# Patient Record
Sex: Male | Born: 1990 | Race: White | Hispanic: No | Marital: Single | Smoking: Current every day smoker
Health system: Southern US, Community
[De-identification: ages and names within clinical notes are randomized; demographics above are authoritative.]

## PROBLEM LIST (undated history)

## (undated) DIAGNOSIS — J45909 Unspecified asthma, uncomplicated: Secondary | ICD-10-CM

---
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
## 2012-11-19 ENCOUNTER — Encounter (HOSPITAL_COMMUNITY): Payer: Self-pay | Admitting: Emergency Medicine

## 2012-11-19 ENCOUNTER — Emergency Department (HOSPITAL_COMMUNITY)
Admission: EM | Admit: 2012-11-19 | Discharge: 2012-11-19 | Disposition: A | Discharge status: Home / Self Care | Payer: Self-pay | Attending: Emergency Medicine | Admitting: Emergency Medicine

## 2012-11-19 ENCOUNTER — Emergency Department (HOSPITAL_COMMUNITY): Payer: Self-pay

## 2012-11-19 DIAGNOSIS — IMO0002 Reserved for concepts with insufficient information to code with codable children: Secondary | ICD-10-CM | POA: Insufficient documentation

## 2012-11-19 DIAGNOSIS — M549 Dorsalgia, unspecified: Secondary | ICD-10-CM

## 2012-11-19 DIAGNOSIS — Y92410 Unspecified street and highway as the place of occurrence of the external cause: Secondary | ICD-10-CM | POA: Insufficient documentation

## 2012-11-19 DIAGNOSIS — Y9389 Activity, other specified: Secondary | ICD-10-CM | POA: Insufficient documentation

## 2012-11-19 DIAGNOSIS — F17200 Nicotine dependence, unspecified, uncomplicated: Secondary | ICD-10-CM | POA: Insufficient documentation

## 2012-11-19 LAB — BASIC METABOLIC PANEL
BUN: 13 mg/dL (ref 6–23)
CO2: 28 mEq/L (ref 19–32)
Calcium: 10.1 mg/dL (ref 8.4–10.5)
Chloride: 101 mEq/L (ref 96–112)
Creatinine, Ser: 0.74 mg/dL (ref 0.50–1.35)
GFR calc Af Amer: >90 mL/min (ref >90)
GFR calc non Af Amer: >90 mL/min (ref >90)
Glucose, Bld: 90 mg/dL (ref 70–99)
Potassium: 3.8 mEq/L (ref 3.5–5.1)
Sodium: 138 mEq/L (ref 135–145)

## 2012-11-19 LAB — CBC WITH DIFFERENTIAL/PLATELET
Basophils Relative: 1 % (ref 0–1)
Eosinophils Absolute: 0.1 10*3/uL (ref 0.0–0.7)
Eosinophils Relative: 2 % (ref 0–5)
HCT: 39.8 % (ref 39.0–52.0)
Hemoglobin: 14.4 g/dL (ref 13.0–17.0)
MCH: 31.9 pg (ref 26.0–34.0)
MCHC: 36.2 g/dL — ABNORMAL HIGH (ref 30.0–36.0)
MCV: 88.1 fL (ref 78.0–100.0)
Monocytes Absolute: 0.6 10*3/uL (ref 0.1–1.0)
Monocytes Relative: 10 % (ref 3–12)
Neutro Abs: 3.1 10*3/uL (ref 1.7–7.7)

## 2012-11-19 LAB — URINALYSIS, ROUTINE W REFLEX MICROSCOPIC
Bilirubin Urine: NEGATIVE
Hgb urine dipstick: NEGATIVE
Ketones, ur: NEGATIVE mg/dL
Specific Gravity, Urine: 1.025 (ref 1.005–1.030)
Urobilinogen, UA: 0.2 mg/dL (ref 0.0–1.0)

## 2012-11-19 MED ORDER — ONDANSETRON HCL 4 MG/2ML IJ SOLN
4.0000 mg | Freq: Once | INTRAMUSCULAR | Status: AC
  Administered 2012-11-19: 4 mg via INTRAVENOUS
  Filled 2012-11-19: qty 2

## 2012-11-19 MED ORDER — HYDROMORPHONE HCL PF 1 MG/ML IJ SOLN
1.0000 mg | Freq: Once | INTRAMUSCULAR | Status: AC
  Administered 2012-11-19: 1 mg via INTRAVENOUS
  Filled 2012-11-19: qty 1

## 2012-11-19 MED ORDER — CYCLOBENZAPRINE HCL 10 MG PO TABS: 10.0000 mg | ORAL_TABLET | Freq: Two times a day (BID) | ORAL | Status: DC | PRN

## 2012-11-19 MED ORDER — IOHEXOL 300 MG/ML  SOLN
100.0000 mL | Freq: Once | INTRAMUSCULAR | Status: AC | PRN
  Administered 2012-11-19: 100 mL via INTRAVENOUS

## 2012-11-19 MED ORDER — SODIUM CHLORIDE 0.9 % IV BOLUS (SEPSIS)
500.0000 mL | Freq: Once | INTRAVENOUS | Status: AC
  Administered 2012-11-19: 500 mL via INTRAVENOUS
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
## 2012-11-30 ENCOUNTER — Emergency Department (HOSPITAL_COMMUNITY): Payer: Self-pay

## 2012-11-30 ENCOUNTER — Encounter (HOSPITAL_COMMUNITY): Payer: Self-pay | Admitting: Emergency Medicine

## 2012-11-30 ENCOUNTER — Emergency Department (HOSPITAL_COMMUNITY)
Admission: EM | Admit: 2012-11-30 | Discharge: 2012-11-30 | Disposition: A | Discharge status: Home / Self Care | Payer: Self-pay | Attending: Emergency Medicine | Admitting: Emergency Medicine

## 2012-11-30 DIAGNOSIS — F17200 Nicotine dependence, unspecified, uncomplicated: Secondary | ICD-10-CM | POA: Insufficient documentation

## 2012-11-30 DIAGNOSIS — M949 Disorder of cartilage, unspecified: Secondary | ICD-10-CM | POA: Insufficient documentation

## 2012-11-30 DIAGNOSIS — S6992XA Unspecified injury of left wrist, hand and finger(s), initial encounter: Secondary | ICD-10-CM

## 2012-11-30 DIAGNOSIS — S8990XA Unspecified injury of unspecified lower leg, initial encounter: Secondary | ICD-10-CM | POA: Insufficient documentation

## 2012-11-30 DIAGNOSIS — IMO0002 Reserved for concepts with insufficient information to code with codable children: Secondary | ICD-10-CM | POA: Insufficient documentation

## 2012-11-30 DIAGNOSIS — S99919A Unspecified injury of unspecified ankle, initial encounter: Secondary | ICD-10-CM | POA: Insufficient documentation

## 2012-11-30 DIAGNOSIS — Y9355 Activity, bike riding: Secondary | ICD-10-CM | POA: Insufficient documentation

## 2012-11-30 DIAGNOSIS — S6980XA Other specified injuries of unspecified wrist, hand and finger(s), initial encounter: Secondary | ICD-10-CM | POA: Insufficient documentation

## 2012-11-30 DIAGNOSIS — M25562 Pain in left knee: Secondary | ICD-10-CM

## 2012-11-30 DIAGNOSIS — T07XXXA Unspecified multiple injuries, initial encounter: Secondary | ICD-10-CM

## 2012-11-30 DIAGNOSIS — Z79899 Other long term (current) drug therapy: Secondary | ICD-10-CM | POA: Insufficient documentation

## 2012-11-30 DIAGNOSIS — S6990XA Unspecified injury of unspecified wrist, hand and finger(s), initial encounter: Secondary | ICD-10-CM | POA: Insufficient documentation

## 2012-11-30 DIAGNOSIS — J45909 Unspecified asthma, uncomplicated: Secondary | ICD-10-CM | POA: Insufficient documentation

## 2012-11-30 DIAGNOSIS — M899 Disorder of bone, unspecified: Secondary | ICD-10-CM | POA: Insufficient documentation

## 2012-11-30 DIAGNOSIS — Y92410 Unspecified street and highway as the place of occurrence of the external cause: Secondary | ICD-10-CM | POA: Insufficient documentation

## 2012-11-30 HISTORY — DX: Unspecified asthma, uncomplicated: J45.909

## 2012-11-30 MED ORDER — BACITRACIN ZINC 500 UNIT/GM EX OINT
TOPICAL_OINTMENT | Freq: Once | CUTANEOUS | Status: AC
  Administered 2012-11-30: 1 via TOPICAL
  Filled 2012-11-30: qty 0.9

## 2012-11-30 MED ORDER — HYDROMORPHONE HCL PF 1 MG/ML IJ SOLN
1.0000 mg | Freq: Once | INTRAMUSCULAR | Status: AC
  Administered 2012-11-30: 1 mg via INTRAMUSCULAR

## 2012-11-30 MED ORDER — CEPHALEXIN 500 MG PO CAPS: 500.0000 mg | ORAL_CAPSULE | Freq: Four times a day (QID) | ORAL | Status: DC

## 2012-11-30 MED ORDER — HYDROCODONE-ACETAMINOPHEN 5-325 MG PO TABS: 1.0000 | ORAL_TABLET | ORAL | Status: DC | PRN

## 2012-11-30 MED ORDER — HYDROCODONE-ACETAMINOPHEN 5-325 MG PO TABS
2.0000 | ORAL_TABLET | Freq: Once | ORAL | Status: AC
  Administered 2012-11-30: 2 via ORAL
  Filled 2012-11-30: qty 2

## 2012-11-30 MED ORDER — HYDROCODONE-ACETAMINOPHEN 5-325 MG PO TABS: 1.0000 | ORAL_TABLET | Freq: Once | ORAL | Status: DC

## 2012-11-30 MED ORDER — HYDROMORPHONE HCL PF 1 MG/ML IJ SOLN
INTRAMUSCULAR | Status: AC
  Filled 2012-11-30: qty 1
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
## 2012-11-30 NOTE — ED Notes (Signed)
No obvious deformities.
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
## 2012-12-01 NOTE — ED Provider Notes (Signed)
Medical screening examination/treatment/procedure(s) were performed by non-physician practitioner and as supervising physician I was immediately available for consultation/collaboration.  Donnetta Hutching, MD 12/01/12 1110

## 2013-09-02 ENCOUNTER — Emergency Department (HOSPITAL_COMMUNITY)
Admission: EM | Admit: 2013-09-02 | Discharge: 2013-09-02 | Disposition: A | Discharge status: Home / Self Care | Payer: Self-pay | Attending: Emergency Medicine | Admitting: Emergency Medicine

## 2013-09-02 ENCOUNTER — Encounter (HOSPITAL_COMMUNITY): Payer: Self-pay | Admitting: Emergency Medicine

## 2013-09-02 DIAGNOSIS — K089 Disorder of teeth and supporting structures, unspecified: Secondary | ICD-10-CM | POA: Insufficient documentation

## 2013-09-02 DIAGNOSIS — K029 Dental caries, unspecified: Secondary | ICD-10-CM | POA: Insufficient documentation

## 2013-09-02 DIAGNOSIS — J45909 Unspecified asthma, uncomplicated: Secondary | ICD-10-CM | POA: Insufficient documentation

## 2013-09-02 DIAGNOSIS — Z880 Allergy status to penicillin: Secondary | ICD-10-CM | POA: Insufficient documentation

## 2013-09-02 DIAGNOSIS — F17200 Nicotine dependence, unspecified, uncomplicated: Secondary | ICD-10-CM | POA: Insufficient documentation

## 2013-09-02 MED ORDER — OXYCODONE-ACETAMINOPHEN 5-325 MG PO TABS
1.0000 | ORAL_TABLET | Freq: Once | ORAL | Status: AC
  Administered 2013-09-02: 1 via ORAL
  Filled 2013-09-02: qty 1

## 2013-09-02 MED ORDER — HYDROCODONE-ACETAMINOPHEN 5-325 MG PO TABS: 1.0000 | ORAL_TABLET | ORAL | Status: DC | PRN

## 2013-09-02 MED ORDER — AMOXICILLIN 500 MG PO CAPS: 500.0000 mg | ORAL_CAPSULE | Freq: Three times a day (TID) | ORAL | Status: DC

## 2013-09-02 NOTE — ED Provider Notes (Signed)
Medical screening examination/treatment/procedure(s) were performed by non-physician practitioner and as supervising physician I was immediately available for consultation/collaboration.     Rane Blitch, MD 09/02/13 1522 

## 2013-09-02 NOTE — ED Notes (Signed)
Pt c/o front L tooth pain. Pt states the tooth has been broken off for about 6 weeks.

## 2013-09-02 NOTE — Discharge Instructions (Signed)
°Emergency Department Resource Guide °1) Find a Doctor and Pay Out of Pocket °Although you won't have to find out who is covered by your insurance plan, it is a good idea to ask around and get recommendations. You will then need to call the office and see if the doctor you have chosen will accept you as a new patient and what types of options they offer for patients who are self-pay. Some doctors offer discounts or will set up payment plans for their patients who do not have insurance, but you will need to ask so you aren't surprised when you get to your appointment. ° °2) Contact Your Local Health Department °Not all health departments have doctors that can see patients for sick visits, but many do, so it is worth a call to see if yours does. If you don't know where your local health department is, you can check in your phone book. The CDC also has a tool to help you locate your state's health department, and many state websites also have listings of all of their local health departments. ° °3) Find a Walk-in Clinic °If your illness is not likely to be very severe or complicated, you may want to try a walk in clinic. These are popping up all over the country in pharmacies, drugstores, and shopping centers. They're usually staffed by nurse practitioners or physician assistants that have been trained to treat common illnesses and complaints. They're usually fairly quick and inexpensive. However, if you have serious medical issues or chronic medical problems, these are probably not your best option. ° °No Primary Care Doctor: °- Call [facility] at  832-8000 - they can help you locate a primary care doctor that  accepts your insurance, provides certain services, etc. °- Physician Referral Service- 1-800-533-3463 ° °Chronic Pain Problems: °Organization         Address  Phone   Notes  °[facility]  (336) 297-2271 Patients need to be referred by their primary care doctor.  ° °Medication  Assistance: °Organization         Address  Phone   Notes  °[place] [facility] [address] °[place], [place] [address] (336) 641-8030 --Must be a resident of [place] °-- Must have NO insurance coverage whatsoever (no Medicaid/ Medicare, etc.) °-- The pt. MUST have a primary care doctor that directs their care regularly and follows them in the community °  °[facility]  (866) 331-1348   °[employer]  (888) 892-1162   ° °Agencies that provide inexpensive medical care: °Organization         Address  Phone   Notes  °[facility]  (336) 832-8035   °[facility]    (336) 832-7272   °[facility] [address] °[place], [place] [address] (336) 832-4777   °[facility] [address], °[place] (336) 271-4999   °[facility]    (336) 373-0678   °[facility]    (336) 272-1050   °[facility] ° [address], [place] Phone:  (336) 832-4444, Fax:  (336) 832-4440 Hours of Operation:  9 am - 6 pm, M-F.  Also accepts Medicaid/Medicare and self-pay.  °[facility] ° [address], [place] Phone: (336) 832-3150, Fax: (336) 832-3151. Hours of Operation:  8:30 am - 5:30 pm, M-F.  Also accepts Medicaid and self-pay.  °[facility] [place] [address], [place] Phone: (336) 878-6027   °[facility] [address], [place], [place] (336)723-1848, Ext. 123 Mondays & Thursdays: 7-9 AM.  First 15 patients are seen on a first come, first serve basis. °  ° °Medicaid-accepting [place] Providers: ° °Organization         Address  Phone   Notes  °[facility] [address], [place] (336) 641-2100 Also accepts self-pay patients.  °[facility] [address], [place] ° (336) 856-9996   °[facility] [address], [place]  (336) 288-8857   °[facility] [address] [place] [address], [place] (336) 299-7000   °Veita Bland [address], [place]  ° (336) 373-1557 Only accepts [place] Access Medicaid patients after they have their name applied to their card.  ° °Self-Pay (no insurance) in [place]: ° °Organization         Address  Phone   Notes  °Sickle Cell Patients, [facility] [address], [place] (336) 832-1970   °[facility] [address], [place] (336) 832-4400   °[facility] ° [address], [place] (336) 992-4800   °[facility]/Dr. Osei-Bonsu ° [address] [place] [address], [place] or [address], [place] (336) 841-8500 Phone number for both [place] and [place] locations is the same.  °[facility] [address], [place] (336) 299-0000   °[facility] [address] [place] [address], [place] or [address] (336) 852-7530 °(336) 878-2260   °[facility] [address], [place] (336) 350-1642, phone; (336) 294-5005, fax Sees patients 1st and 3rd Saturday of every month.  Must not qualify for public or private insurance (i.e. Medicaid, Medicare, [place] Health Choice, Veterans' Benefits) • Household income should be no more than 200% of the poverty level •The clinic cannot treat you if you are pregnant or think you are pregnant • Sexually transmitted diseases are not treated at the clinic.  ° ° °Dental Care: °Organization         Address  Phone  Notes  °[place] [facility] [address], [place] (336) 641-6152 Accepts children up to age 21 who are enrolled in Medicaid or [place] Health Choice; pregnant women with a Medicaid card; and children who have applied for Medicaid or [place] Health Choice, but were declined, whose parents can pay a reduced fee at time of service.  °[place]  [facility] [place]  [address], [place] (336) 641-7733 Accepts children up to age 21 who are enrolled in Medicaid or [place] Health Choice; pregnant women with a Medicaid card; and children who have applied for Medicaid or [place] Health Choice, but were declined, whose parents can pay a reduced fee at time of service.  °[facility] ° [address], [place] (336) 641-4533 Patients are seen by appointment only. Walk-ins are not accepted. [facility] will see patients 18 years of age and older. °Monday - Tuesday (8am-5pm) °Most Wednesdays (8:30-5pm) °$30 per visit, cash only  °[facility] ° [address], [place] (336) 641-4533 Patients are seen by appointment only. Walk-ins are not accepted. [facility] will see patients 18 years of age and older. °One   Wednesday Evening (Monthly: Volunteer Based).  $30 per visit, cash only  °[facility]  (919) 537-3737 for adults; Children under age 4, call Graduate Pediatric Dentistry at (919) [phone]. Children aged 4-14, please call (919) [phone] to request a pediatric application. ° Dental services are provided in all areas of dental care including fillings, crowns and bridges, complete and partial dentures, implants, gum treatment, root canals, and extractions. Preventive care is also provided. Treatment is provided to both adults and children. °Patients are selected via a lottery and there is often a waiting list. °  °[facility] [address], °[place] ° (336) 763-8833 [email or web address] °  °[facility] [address], [place], [place] (336)723-1848, Ext. 123 Second and Fourth Thursday of each month, opens at 6:30 AM; Clinic ends at 9 AM.  Patients are seen on a first-come first-served basis, and a limited number are seen during each clinic.  ° °[facility] ° [address], [place], [place] (336) 723-7904    Eligibility Requirements °You must have lived in [place], or [place] counties for at least the last three months. °  You cannot be eligible for state or federal sponsored healthcare insurance, including Veterans Administration, Medicaid, or Medicare. °  You generally cannot be eligible for healthcare insurance through your employer.  °  How to apply: °Eligibility screenings are held every Tuesday and Wednesday afternoon from 1:00 pm until 4:00 pm. You do not need an appointment for the interview!  °[facility] [address], [place], [place] 336-631-2330   °[facility]  336-342-8273   °[facility]  336-703-3100   °[facility]  336-570-6415   ° °Behavioral Health Resources in the Community: °Intensive Outpatient Programs °Organization         Address  Phone  Notes  °[facility] [address], [place], [place] 336-878-6098   °[facility] [address], [place], [place] 336-832-9800   °[facility] [address], [place], [place] ° 336-882-2125   °[facility] [address],  °[place], [place] 1-800-853-5163 or 336-641-4981   °Substance Abuse Resources °Organization         Address  Phone  Notes  °[facility]  336-882-2125   °[facility]  336-784-9470   °[facility]  336-285-9073   °[facility]  336-845-3988   °Residential & Outpatient Substance Abuse Program  1-800-659-3381   °Psychological Services °Organization         Address  Phone  Notes  °[facility]  336- 832-9600   °[facility]  336- 378-7881   °[facility] [address], [place] 1-800-853-5163 or 336-641-4981   ° °Mobile Crisis Teams °Organization         Address  Phone  Notes  °[facility], Mobile Crisis Care Unit  1-877-626-1772   °Assertive °[facility] ° [address]  [place], [place] 336-834-9664   °Sharon DeEsch [address] °[place] 336-554-5454   ° °Self-Help/Support Groups °Organization         Address  Phone             Notes  °[facility]  - variety of support groups  336- 373-1402 Call for more information  °Narcotics Anonymous (NA), [facility] [address], °[place]  2 meetings at this location  ° °  Residential Treatment Programs Organization         Address  Phone  Notes  [facility] [address],    [place]  1-478-295-6213   [facility]  [address] [place] [address], [place], [place] 469-629-5284   [facility] [address] [place], [place] 132-440-1027 Admissions: 8am-3pm M-F  [facility] [address]    [place], [place] 253-664-4034   [facility] [address] [place], [place], [place] 742-595-6387   [facility] [address]  [place], [place] 564-332-9518   [facility] Intensive Outpatient [address], [place], [place] 841-660-6301   [facility] ([facility] [address]  [place], [place] 6-010-932-3557 or 209 666 1742   [facility] [address], [place], [place] 623-762-8315 Accepts Medicaid  [facility] [address],  [place] 1-761-607-3710 Substance Abuse/Addiction Treatment   [facility] Organization         Address  Phone  Notes  [facility]  (984) 779-1044   Angie Fava, PhD [address] [place] [place] [place], [place]   779-781-1817 or 340-296-5359   [facility]   [address] [place], [place] (763)392-6391   [facility] [address] [place], [place] 6705027895 Insurance/Medicaid/sponsorship through [facility] [address]                                    [place], [place] [phone] Therapy/tele-psych/case    [facility] [address], [place] (408)455-1835    Dr. Lolly Mustache  250-095-0985   [facility]  [employer] [facility] Dept. 1) [address] [place] 2) [address] [place] [address] [place] 3)  [address], [place] (409)[phone] (215) [phone]  (831)600-9872   [facility] Child Abuse Hotline 336-254-9745 or 404-107-0473 (After Hours)      Dental Pain Toothache is pain in or around a tooth. It may get worse with chewing or with cold or heat.  HOME CARE  Your dentist may use a numbing medicine during treatment. If so, you may need to avoid eating until the medicine wears off. Ask your dentist about this.  Only take medicine as told by your dentist or doctor.  Avoid chewing food near the painful tooth until after all treatment is done. Ask your dentist about this. GET HELP RIGHT AWAY IF:   The problem gets worse or new problems appear.  You have a fever.  There is redness and puffiness (swelling) of the face, jaw, or neck.  You cannot open your mouth.  There is pain in the jaw.  There is very bad pain that is not helped by medicine. MAKE SURE YOU:   Understand these instructions.  Will watch your condition.  Will get help right away if you are not doing well or get worse. Document Released: 10/31/2007 Document Revised: 08/06/2011 Document Reviewed: 10/31/2007 [facility] Patient Information 2014 [place], [place].  Dental Caries Dental caries is tooth decay. This decay can cause a hole in teeth (cavity) that can get bigger and deeper over time. HOME CARE  Brush and floss your teeth. Do this at least two times a day.  Use a fluoride toothpaste.  Use a  mouth rinse if told by your dentist or doctor.  Eat less sugary and starchy foods. Drink less sugary drinks.  Avoid snacking often on sugary and starchy foods. Avoid sipping often on sugary drinks.  Keep regular checkups and cleanings with your dentist.  Use  fluoride supplements if told by your dentist or doctor.  Allow fluoride to be applied to teeth if told by your dentist or doctor. MAKE SURE YOU:  Understand these instructions.  Will watch your condition.  Will get help right away if you are not doing well or get worse. Document Released: 02/21/2008 Document Revised: 01/14/2013 Document Reviewed: 05/16/2012 [facility] Patient Information 2014 [place].

## 2013-09-02 NOTE — ED Provider Notes (Signed)
CSN: [id number]     Arrival date & time 09/02/13  1240 History   First MD Initiated Contact with Patient 09/02/13 1244     Chief Complaint  Patient presents with  . Dental Pain     (Consider location/radiation/quality/duration/timing/severity/associated sxs/prior Treatment) Patient is a 23 y.o. male presenting with tooth pain. The history is provided by the patient.  Dental Pain Location:  Upper Upper teeth location:  7/RU lateral incisor Quality:  Constant and throbbing Severity:  Severe Onset quality:  Gradual Duration:  1 day Timing:  Constant Progression:  Worsening Chronicity:  New Context: abscess, dental caries and poor dentition   Relieved by:  Nothing Worsened by:  Cold food/drink, touching and pressure Ineffective treatments:  Acetaminophen Associated symptoms: no fever and no oral lesions    Jonathan Hobbs is a 23 y.o. male who presents to the ED with dental pain. He states that he broke a tooth on the left upper about 6 weeks ago. Last night while eating he started having pain and it has continued. He was unable to sleep last night. He also has a wisdom tooth this is coming in on the top left and it is very painful.   Past Medical History  Diagnosis Date  . Asthma    History reviewed. No pertinent past surgical history. Family History  Problem Relation Age of Onset  . Cancer Other   . Diabetes Other    History  Substance Use Topics  . Smoking status: Current Every Day Smoker -- 0.50 packs/day for 7 years    Types: Cigarettes  . Smokeless tobacco: Never Used  . Alcohol Use: No    Review of Systems  Constitutional: Negative for fever and chills.  HENT: Positive for dental problem. Negative for ear pain, mouth sores and sore throat.   Respiratory: Negative for cough and wheezing.   Cardiovascular: Negative for chest pain.  Gastrointestinal: Negative for nausea, vomiting and abdominal pain.  Musculoskeletal: Negative for back pain.  Skin: Negative for rash.    Psychiatric/Behavioral: Negative for confusion.      Allergies  Ibuprofen; Oxycontin; and Penicillins  Home Medications   Current Outpatient Rx  Name  Route  Sig  Dispense  Refill  . Aspirin-Caffeine (BC FAST PAIN RELIEF PO)   Oral   Take 1 Package by mouth daily as needed (pain).          BP 141/75  Pulse 82  Temp(Src) 98.7 F (37.1 C) (Oral)  Resp 18  Ht 6' (1.829 m)  Wt 140 lb (63.504 kg)  BMI 18.98 kg/m2  SpO2 100% Physical Exam  Nursing note and vitals reviewed. Constitutional: He is oriented to person, place, and time. He appears well-developed and well-nourished.  HENT:  Head: Normocephalic.  Mouth/Throat: Uvula is midline, oropharynx is clear and moist and mucous membranes are normal. Dental caries present.    Broken tooth upper left. Left upper 3rd molar with swelling and erythema of gum surrounding the tooth. Multiple dental caries. Poor detention.  Eyes: Conjunctivae and EOM are normal.  Neck: Neck supple.  Cardiovascular: Normal rate and regular rhythm.   Pulmonary/Chest: Effort normal. He has no wheezes.  Abdominal: Soft. There is no tenderness.  Musculoskeletal: Normal range of motion.  Neurological: He is alert and oriented to person, place, and time. No cranial nerve deficit.  Skin: Skin is warm and dry.  Psychiatric: He has a normal mood and affect. His behavior is normal.    ED Course  Procedures  MDM  23 y.o. male with multiple dental caries, broken tooth that is painful and upper left 3rd molar that appears infected. Will treat with antibiotics and pain medication and he will follow up with a dentist as soon as possible. Stable for discharge without fever or trismus. Discussed with the patient and all questioned fully answered. He will return if any problems arise.     Medication List    TAKE these medications       amoxicillin 500 MG capsule  Commonly known as:  AMOXIL  Take 1 capsule (500 mg total) by mouth 3 (three) times daily.      HYDROcodone-acetaminophen 5-325 MG per tablet  Commonly known as:  NORCO/VICODIN  Take 1 tablet by mouth every 4 (four) hours as needed.      ASK your doctor about these medications       BC FAST PAIN RELIEF PO  Take 1 Package by mouth daily as needed (pain).           [facility] Orlene Och, [place] 09/02/13 [phone]

## 2013-11-12 ENCOUNTER — Encounter (HOSPITAL_COMMUNITY): Payer: Self-pay | Admitting: Emergency Medicine

## 2013-11-12 ENCOUNTER — Emergency Department (HOSPITAL_COMMUNITY)
Admission: EM | Admit: 2013-11-12 | Discharge: 2013-11-12 | Disposition: A | Discharge status: Home / Self Care | Payer: Self-pay | Attending: Emergency Medicine | Admitting: Emergency Medicine

## 2013-11-12 DIAGNOSIS — F17200 Nicotine dependence, unspecified, uncomplicated: Secondary | ICD-10-CM | POA: Insufficient documentation

## 2013-11-12 DIAGNOSIS — Z79899 Other long term (current) drug therapy: Secondary | ICD-10-CM | POA: Insufficient documentation

## 2013-11-12 DIAGNOSIS — Y929 Unspecified place or not applicable: Secondary | ICD-10-CM | POA: Insufficient documentation

## 2013-11-12 DIAGNOSIS — R238 Other skin changes: Secondary | ICD-10-CM

## 2013-11-12 DIAGNOSIS — X58XXXA Exposure to other specified factors, initial encounter: Secondary | ICD-10-CM | POA: Insufficient documentation

## 2013-11-12 DIAGNOSIS — J45909 Unspecified asthma, uncomplicated: Secondary | ICD-10-CM | POA: Insufficient documentation

## 2013-11-12 DIAGNOSIS — Z880 Allergy status to penicillin: Secondary | ICD-10-CM | POA: Insufficient documentation

## 2013-11-12 DIAGNOSIS — Y9389 Activity, other specified: Secondary | ICD-10-CM | POA: Insufficient documentation

## 2013-11-12 DIAGNOSIS — Z792 Long term (current) use of antibiotics: Secondary | ICD-10-CM | POA: Insufficient documentation

## 2013-11-12 DIAGNOSIS — IMO0002 Reserved for concepts with insufficient information to code with codable children: Secondary | ICD-10-CM | POA: Insufficient documentation

## 2013-11-12 MED ORDER — BACITRACIN-NEOMYCIN-POLYMYXIN 400-5-5000 EX OINT
TOPICAL_OINTMENT | Freq: Once | CUTANEOUS | Status: AC
  Administered 2013-11-12: 2 via TOPICAL
  Filled 2013-11-12: qty 2

## 2013-11-12 MED ORDER — CEPHALEXIN 500 MG PO CAPS
500.0000 mg | ORAL_CAPSULE | Freq: Once | ORAL | Status: AC
  Administered 2013-11-12: 500 mg via ORAL
  Filled 2013-11-12: qty 1

## 2013-11-12 NOTE — ED Provider Notes (Signed)
CSN: [id number]     Arrival date & time 11/12/13  1636 History   First MD Initiated Contact with Patient 11/12/13 1707     Chief Complaint  Patient presents with  . Blister     (Consider location/radiation/quality/duration/timing/severity/associated sxs/prior Treatment) HPI Comments: This patient presents to the emergency department with complaint of blisters of both feet. The patient states that he is job requires him to stand for 10-12 hours almost daily and he has been working for a week almost nonstop. He states he has to walk up and down multiple levels and it puts a lot of friction on his feet. He developed blisters a few days ago. He ruptured one of the blisters and tried a TRAM some of the skin away. He noticed a serosanguineous drainage but noticed some increased redness from the area that he trimmed part of the blister and he was concerned about possible infection. He also has a blister of the right foot as well. He has not ruptured blister. He has no compromises of his immune system that he is aware of. He is not diabetic. He has not had any procedures involving either the right or the left foot.  The history is provided by the patient.    Past Medical History  Diagnosis Date  . Asthma    History reviewed. No pertinent past surgical history. Family History  Problem Relation Age of Onset  . Cancer Other   . Diabetes Other    History  Substance Use Topics  . Smoking status: Current Every Day Smoker -- 0.50 packs/day for 7 years    Types: Cigarettes  . Smokeless tobacco: Never Used  . Alcohol Use: No    Review of Systems  Constitutional: Negative for activity change.       All ROS Neg except as noted in HPI  HENT: Negative for nosebleeds.   Eyes: Negative for photophobia and discharge.  Respiratory: Negative for cough, shortness of breath and wheezing.   Cardiovascular: Negative for chest pain and palpitations.  Gastrointestinal: Negative for abdominal pain and blood  in stool.  Genitourinary: Negative for dysuria, frequency and hematuria.  Musculoskeletal: Negative for arthralgias, back pain and neck pain.  Skin: Negative.   Neurological: Negative for dizziness, seizures and speech difficulty.  Psychiatric/Behavioral: Negative for hallucinations and confusion.      Allergies  Ibuprofen; Oxycontin; and Penicillins  Home Medications   Prior to Admission medications   Medication Sig Start Date End Date Taking? Authorizing Provider  amoxicillin (AMOXIL) 500 MG capsule Take 1 capsule (500 mg total) by mouth 3 (three) times daily. 09/02/13   Hope Orlene OchM Neese, NP  Aspirin-Caffeine (BC FAST PAIN RELIEF PO) Take 1 Package by mouth daily as needed (pain).    Historical Provider, MD  HYDROcodone-acetaminophen (NORCO/VICODIN) 5-325 MG per tablet Take 1 tablet by mouth every 4 (four) hours as needed. 09/02/13   Hope Orlene OchM Neese, NP   BP 150/82  Pulse 111  Temp(Src) 97.9 F (36.6 C) (Oral)  Resp 18  Ht 5\' 11"  (1.803 m)  Wt 120 lb 11.2 oz (54.749 kg)  BMI 16.84 kg/m2  SpO2 100% Physical Exam  Nursing note and vitals reviewed. Constitutional: He is oriented to person, place, and time. He appears well-developed and well-nourished.  Non-toxic appearance.  HENT:  Head: Normocephalic.  Right Ear: Tympanic membrane and external ear normal.  Left Ear: Tympanic membrane and external ear normal.  Eyes: EOM and lids are normal. Pupils are equal, round, and reactive to light.  Neck: Normal range of motion. Neck supple. Carotid bruit is not present.  Cardiovascular: Normal rate, regular rhythm, normal heart sounds, intact distal pulses and normal pulses.   Pulmonary/Chest: Breath sounds normal. No respiratory distress.  Abdominal: Soft. Bowel sounds are normal. There is no tenderness. There is no guarding.  Musculoskeletal: Normal range of motion.  The patient has a denuded blister of the left foot. This is located between the first and second toe at the plantar surface.  There is no increased redness present. There is no red streaking, and there's no pus like material draining. The dorsalis pedis and posterior tibial pulses are 2+, the capillary refill is less than 2 seconds.  There is a blister on the right foot between the first and second toes. There is no red streaking appreciated. This blister appears to be intact and appears to be a fluid-filled. The dorsalis pedis pulse is 2+, the capillary refill is less then 2 seconds.  Lymphadenopathy:       Head (right side): No submandibular adenopathy present.       Head (left side): No submandibular adenopathy present.    He has no cervical adenopathy.  Neurological: He is alert and oriented to person, place, and time. He has normal strength. No cranial nerve deficit or sensory deficit.  Skin: Skin is warm and dry.  Psychiatric: He has a normal mood and affect. His speech is normal.    ED Course  Procedures (including critical care time) Labs Review Labs Reviewed - No data to display  Imaging Review No results found.   EKG Interpretation None      MDM Patient has blisters of both feet. Probably related to friction from a standing for long hours and also from walking up and down multiple steps and levels. The patient is treated with a Neosporin dressing, postoperative shoes, and a prescription for Keflex as a preventive measure. The patient is to followup with the health department, or return to the emergency department if not improving.    Final diagnoses:  Blisters of multiple sites    **I have reviewed nursing notes, vital signs, and all appropriate lab and imaging results for this patient.Kathie Dike*    Hobson M Bryant, PA-C 11/12/13 1750

## 2013-11-12 NOTE — Discharge Instructions (Signed)
Please soak your feet in warm Epsom salt water daily. Please apply a dressing to the blister areas, please use the postoperative shoe until you can safely where your regular shoes. Please use Keflex 4 times daily with food. Blisters Blisters are fluid-filled sacs that form within the skin. Common causes of blistering are friction, burns, and exposure to irritating chemicals. The fluid in the blister protects the underlying damaged skin. Most of the time it is not recommended that you open blisters. When a blister is opened, there is an increased chance for infection. Usually, a blister will open on its own. They then dry up and peel off within 10 days. If the blister is tense and uncomfortable (painful) the fluid may be drained. If it is drained the roof of the blister should be left intact. The draining should only be done by a medical professional under aseptic conditions. Poorly fitting shoes and boots can cause blisters by being too tight or too loose. Wearing extra socks or using tape, bandages, or pads over the blister-prone area helps prevent the problem by reducing friction. Blisters heal more slowly if you have diabetes or if you have problems with your circulation. You need to be careful about medical follow-up to prevent infection. HOME CARE INSTRUCTIONS  Protect areas where blisters have formed until the skin is healed. Use a special bandage with a hole cut in the middle around the blister. This reduces pressure and friction. When the blister breaks, trim off the loose skin and keep the area clean by washing it with soap daily. Soaking the blister or broken-open blister with diluted vinegar twice daily for 15 minutes will dry it up and speed the healing. Use 3 tablespoons of white vinegar per quart of water (45 mL white vinegar per liter of water). An antibiotic ointment and a bandage can be used to cover the area after soaking.  SEEK MEDICAL CARE IF:   You develop increased redness, pain,  swelling, or drainage in the blistered area.  You develop a pus-like discharge from the blistered area, chills, or a fever. MAKE SURE YOU:   Understand these instructions.  Will watch your condition.  Will get help right away if you are not doing well or get worse. Document Released: 06/21/2004 Document Revised: 08/06/2011 Document Reviewed: 05/19/2008 [facility] Patient Information 2015 [place], [place]. This information is not intended to replace advice given to you by your health care provider. Make sure you discuss any questions you have with your health care provider.

## 2013-11-12 NOTE — ED Notes (Signed)
Pt states he has blisters on bottom of feet from working 12hr shifts. Stated he popped the one on the left and "pus came out".

## 2013-11-12 NOTE — ED Notes (Signed)
Patient c/o blisters on bottom of feet bilateral. Per patient working on feet 12 days a week and walking up multiple steps. Per patient "busted blister on bottom of left in which he had serosanguinous drainage.

## 2013-11-12 NOTE — ED Provider Notes (Signed)
Medical screening examination/treatment/procedure(s) were performed by non-physician practitioner and as supervising physician I was immediately available for consultation/collaboration.   EKG Interpretation None      Devoria AlbeIva Knapp, MD, Armando GangFACEP   Ward GivensIva L Knapp, MD 11/12/13 [phone]

## 2013-12-30 ENCOUNTER — Encounter (HOSPITAL_COMMUNITY): Payer: Self-pay | Admitting: Emergency Medicine

## 2013-12-30 ENCOUNTER — Emergency Department (HOSPITAL_COMMUNITY)
Admission: EM | Admit: 2013-12-30 | Discharge: 2013-12-30 | Disposition: A | Discharge status: Home / Self Care | Payer: Self-pay | Attending: Emergency Medicine | Admitting: Emergency Medicine

## 2013-12-30 DIAGNOSIS — J45909 Unspecified asthma, uncomplicated: Secondary | ICD-10-CM | POA: Insufficient documentation

## 2013-12-30 DIAGNOSIS — Z880 Allergy status to penicillin: Secondary | ICD-10-CM | POA: Insufficient documentation

## 2013-12-30 DIAGNOSIS — Z792 Long term (current) use of antibiotics: Secondary | ICD-10-CM | POA: Insufficient documentation

## 2013-12-30 DIAGNOSIS — F17200 Nicotine dependence, unspecified, uncomplicated: Secondary | ICD-10-CM | POA: Insufficient documentation

## 2013-12-30 DIAGNOSIS — R21 Rash and other nonspecific skin eruption: Secondary | ICD-10-CM | POA: Insufficient documentation

## 2013-12-30 DIAGNOSIS — B86 Scabies: Secondary | ICD-10-CM | POA: Insufficient documentation

## 2013-12-30 DIAGNOSIS — Z79899 Other long term (current) drug therapy: Secondary | ICD-10-CM | POA: Insufficient documentation

## 2013-12-30 MED ORDER — PERMETHRIN 5 % EX CREA: TOPICAL_CREAM | CUTANEOUS | Status: DC

## 2013-12-30 MED ORDER — DIPHENHYDRAMINE HCL 25 MG PO TABS: 25.0000 mg | ORAL_TABLET | Freq: Four times a day (QID) | ORAL | Status: DC

## 2013-12-30 MED ORDER — PREDNISONE 10 MG PO TABS: 20.0000 mg | ORAL_TABLET | Freq: Two times a day (BID) | ORAL | Status: DC

## 2013-12-30 NOTE — ED Notes (Signed)
Rash to body began 1 month ago.

## 2013-12-30 NOTE — ED Provider Notes (Signed)
CSN: [id number]     Arrival date & time 12/30/13  1247 History   First MD Initiated Contact with Patient 12/30/13 1344     Chief Complaint  Patient presents with  . Rash     (Consider location/radiation/quality/duration/timing/severity/associated sxs/prior Treatment) Patient is a 23 y.o. male presenting with rash. The history is provided by the patient.  Rash Quality: itchiness   Severity:  Moderate Onset quality:  Gradual Duration:  1 month Timing:  Constant Progression:  Worsening Chronicity:  New Relieved by:  Nothing Worsened by:  Heat Ineffective treatments:  Anti-itch cream  Jonathan Hobbs is a 23 y.o. male who presents to the ED with a rash that started a month ago. It is getting worse. It itches so much he has trouble sleeping. The itching gets worse at night. The rash is located between the fingers, on the wrist, elbows, bilateral inner thighs and lower abdomen. There are a few areas in the genital region. He has used OTC creams without relief. He has had poison oak in the past but this seems worse.   Past Medical History  Diagnosis Date  . Asthma    History reviewed. No pertinent past surgical history. Family History  Problem Relation Age of Onset  . Cancer Other   . Diabetes Other    History  Substance Use Topics  . Smoking status: Current Every Day Smoker -- 0.50 packs/day for 7 years    Types: Cigarettes  . Smokeless tobacco: Never Used  . Alcohol Use: Yes     Comment: Occ    Review of Systems  Skin: Positive for rash.  all other systems negative    Allergies  Ibuprofen; Oxycontin; and Penicillins  Home Medications   Prior to Admission medications   Medication Sig Start Date End Date Taking? Authorizing Provider  amoxicillin (AMOXIL) 500 MG capsule Take 1 capsule (500 mg total) by mouth 3 (three) times daily. 09/02/13   Hope Orlene OchM Neese, NP  Aspirin-Caffeine (BC FAST PAIN RELIEF PO) Take 1 Package by mouth daily as needed (pain).    Historical Provider,  MD  HYDROcodone-acetaminophen (NORCO/VICODIN) 5-325 MG per tablet Take 1 tablet by mouth every 4 (four) hours as needed. 09/02/13   Hope Orlene OchM Neese, NP   BP 150/74  Pulse 92  Temp(Src) 99.1 F (37.3 C) (Oral)  Resp 16  Ht 5\' 11"  (1.803 m)  Wt 130 lb (58.968 kg)  BMI 18.14 kg/m2  SpO2 99% Physical Exam  Nursing note and vitals reviewed. Constitutional: He is oriented to person, place, and time. He appears well-developed and well-nourished.  Eyes: EOM are normal.  Neck: Neck supple.  Cardiovascular: Normal rate.   Pulmonary/Chest: Effort normal.  Musculoskeletal: Normal range of motion.  Neurological: He is alert and oriented to person, place, and time. No cranial nerve deficit.  Skin: Rash noted.  There are erythematous papules noted on the inner aspects of thighs, bilateral hands between fingers, elbows, ankles and lower abdomen.   Psychiatric: He has a normal mood and affect. His behavior is normal.    ED Course  Procedures   MDM  23 y.o. male with rash and itching that gets worse at night while trying to sleep. Will treat for scabies and refer to dermatology if symptoms persist. Discussed with the patient and all questioned fully answered. He will return if any problems arise.    Medication List    TAKE these medications       diphenhydrAMINE 25 MG tablet  Commonly known  as:  BENADRYL  Take 1 tablet (25 mg total) by mouth every 6 (six) hours.     permethrin 5 % cream  Commonly known as:  ELIMITE  Apply to affected area once     predniSONE 10 MG tablet  Commonly known as:  DELTASONE  Take 2 tablets (20 mg total) by mouth 2 (two) times daily with a meal.      ASK your doctor about these medications       amoxicillin 500 MG capsule  Commonly known as:  AMOXIL  Take 1 capsule (500 mg total) by mouth 3 (three) times daily.     BC FAST PAIN RELIEF PO  Take 1 Package by mouth daily as needed (pain).     HYDROcodone-acetaminophen 5-325 MG per tablet  Commonly known as:   NORCO/VICODIN  Take 1 tablet by mouth every 4 (four) hours as needed.           [facility] Orlene Och, [place] 12/30/13 [phone]

## 2013-12-30 NOTE — Discharge Instructions (Signed)

## 2014-01-05 NOTE — ED Provider Notes (Signed)
Medical screening examination/treatment/procedure(s) were performed by non-physician practitioner and as supervising physician I was immediately available for consultation/collaboration.   EKG Interpretation None        Veron Senner L Adalei Novell, MD 01/05/14 0910 

## 2015-01-10 ENCOUNTER — Emergency Department (HOSPITAL_COMMUNITY)
Admission: EM | Admit: 2015-01-10 | Discharge: 2015-01-10 | Disposition: A | Discharge status: Home / Self Care | Payer: Self-pay | Attending: Emergency Medicine | Admitting: Emergency Medicine

## 2015-01-10 ENCOUNTER — Emergency Department (HOSPITAL_COMMUNITY): Payer: Self-pay

## 2015-01-10 ENCOUNTER — Encounter (HOSPITAL_COMMUNITY): Payer: Self-pay | Admitting: Emergency Medicine

## 2015-01-10 DIAGNOSIS — Z792 Long term (current) use of antibiotics: Secondary | ICD-10-CM | POA: Insufficient documentation

## 2015-01-10 DIAGNOSIS — S7012XA Contusion of left thigh, initial encounter: Secondary | ICD-10-CM | POA: Insufficient documentation

## 2015-01-10 DIAGNOSIS — S299XXA Unspecified injury of thorax, initial encounter: Secondary | ICD-10-CM | POA: Insufficient documentation

## 2015-01-10 DIAGNOSIS — J45909 Unspecified asthma, uncomplicated: Secondary | ICD-10-CM | POA: Insufficient documentation

## 2015-01-10 DIAGNOSIS — S0093XA Contusion of unspecified part of head, initial encounter: Secondary | ICD-10-CM | POA: Insufficient documentation

## 2015-01-10 DIAGNOSIS — S4992XA Unspecified injury of left shoulder and upper arm, initial encounter: Secondary | ICD-10-CM | POA: Insufficient documentation

## 2015-01-10 DIAGNOSIS — T148XXA Other injury of unspecified body region, initial encounter: Secondary | ICD-10-CM

## 2015-01-10 DIAGNOSIS — Z880 Allergy status to penicillin: Secondary | ICD-10-CM | POA: Insufficient documentation

## 2015-01-10 DIAGNOSIS — T148 Other injury of unspecified body region: Secondary | ICD-10-CM | POA: Insufficient documentation

## 2015-01-10 DIAGNOSIS — Z7952 Long term (current) use of systemic steroids: Secondary | ICD-10-CM | POA: Insufficient documentation

## 2015-01-10 DIAGNOSIS — S40012A Contusion of left shoulder, initial encounter: Secondary | ICD-10-CM | POA: Insufficient documentation

## 2015-01-10 DIAGNOSIS — Y998 Other external cause status: Secondary | ICD-10-CM | POA: Insufficient documentation

## 2015-01-10 DIAGNOSIS — Y9389 Activity, other specified: Secondary | ICD-10-CM | POA: Insufficient documentation

## 2015-01-10 DIAGNOSIS — Y92410 Unspecified street and highway as the place of occurrence of the external cause: Secondary | ICD-10-CM | POA: Insufficient documentation

## 2015-01-10 DIAGNOSIS — S00432A Contusion of left ear, initial encounter: Secondary | ICD-10-CM | POA: Insufficient documentation

## 2015-01-10 DIAGNOSIS — Z720 Tobacco use: Secondary | ICD-10-CM | POA: Insufficient documentation

## 2015-01-10 DIAGNOSIS — S3991XA Unspecified injury of abdomen, initial encounter: Secondary | ICD-10-CM | POA: Insufficient documentation

## 2015-01-10 DIAGNOSIS — T07XXXA Unspecified multiple injuries, initial encounter: Secondary | ICD-10-CM

## 2015-01-10 MED ORDER — ACETAMINOPHEN 500 MG PO TABS
1000.0000 mg | ORAL_TABLET | Freq: Once | ORAL | Status: AC
  Administered 2015-01-10: 1000 mg via ORAL
  Filled 2015-01-10: qty 2

## 2015-01-10 MED ORDER — CYCLOBENZAPRINE HCL 10 MG PO TABS: 10.0000 mg | ORAL_TABLET | Freq: Three times a day (TID) | ORAL | Status: DC | PRN

## 2015-01-10 MED ORDER — CYCLOBENZAPRINE HCL 10 MG PO TABS
10.0000 mg | ORAL_TABLET | Freq: Once | ORAL | Status: AC
  Administered 2015-01-10: 10 mg via ORAL
  Filled 2015-01-10: qty 1
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
## 2021-06-06 ENCOUNTER — Emergency Department (HOSPITAL_COMMUNITY)
Admission: EM | Admit: 2021-06-06 | Discharge: 2021-06-06 | Disposition: A | Discharge status: Home / Self Care | Payer: Self-pay | Attending: Emergency Medicine | Admitting: Emergency Medicine

## 2021-06-06 ENCOUNTER — Encounter (HOSPITAL_COMMUNITY): Payer: Self-pay | Admitting: *Deleted

## 2021-06-06 DIAGNOSIS — J069 Acute upper respiratory infection, unspecified: Secondary | ICD-10-CM | POA: Insufficient documentation

## 2021-06-06 DIAGNOSIS — R300 Dysuria: Secondary | ICD-10-CM | POA: Insufficient documentation

## 2021-06-06 DIAGNOSIS — Z7982 Long term (current) use of aspirin: Secondary | ICD-10-CM | POA: Insufficient documentation

## 2021-06-06 DIAGNOSIS — R34 Anuria and oliguria: Secondary | ICD-10-CM

## 2021-06-06 LAB — URINALYSIS, ROUTINE W REFLEX MICROSCOPIC
Bilirubin Urine: NEGATIVE
Glucose, UA: NEGATIVE mg/dL
Hgb urine dipstick: NEGATIVE
Ketones, ur: NEGATIVE mg/dL
Leukocytes,Ua: NEGATIVE
Nitrite: NEGATIVE
Protein, ur: NEGATIVE mg/dL
Specific Gravity, Urine: 1.023 (ref 1.005–1.030)
pH: 6 (ref 5.0–8.0)

## 2021-06-06 NOTE — ED Triage Notes (Signed)
Urinary retention x 3 days, also has cough

## 2021-06-06 NOTE — ED Provider Notes (Signed)
[facility] EMERGENCY DEPARTMENT Provider Note   CSN: [id number] Arrival date & time: 06/06/21  1408     History  No chief complaint on file.   Jonathan Hobbs is a 31 y.o. male.  HPI  Patient without significant medical history presents to the emergency department with multiple complaints.  First complaint is decrease in urination, states gone for last couple days, states that he still urinating but he states he is urinating as frequency, denies dysuria or hematuria denies any penile discharge or testicular pain denies any flank tenderness stomach pains nausea vomiting diarrhea.  No history of kidney stones or UTIs states he is sexually active has not been tested for STDs.  Second complaint is cough, going on for the last month's time, states that the cough is productive, has nasal congestion denies any sore throat will have occasional fevers and chills denies any general body aches, he is vaccinated COVID influenza had a negative respiratory panel when he was at the emergency department, states been taking over-the-counter pain medication seems to help.  Third complaint stomach pain concerned they might have a hernia, states his bellybutton seems more large than usual no nausea or vomiting still passing gas and having normal bowel movements  Home Medications Prior to Admission medications   Medication Sig Start Date End Date Taking? Authorizing Provider  amoxicillin (AMOXIL) 500 MG capsule Take 1 capsule (500 mg total) by mouth 3 (three) times daily. Patient not taking: Reported on 01/10/2015 09/02/13   Ashley Murrain, NP  Aspirin-Caffeine [facility] FAST PAIN RELIEF PO) Take 1 Package by mouth daily as needed (pain).    [provider]  cyclobenzaprine (FLEXERIL) 10 MG tablet Take 1 tablet (10 mg total) by mouth 3 (three) times daily as needed for muscle spasms. 01/10/15   Lily Kocher, PA-C  diphenhydrAMINE (BENADRYL) 25 MG tablet Take 1 tablet (25 mg total) by mouth every 6 (six)  hours. Patient not taking: Reported on 01/10/2015 12/30/13   Ashley Murrain, NP  HYDROcodone-acetaminophen (NORCO/VICODIN) 5-325 MG per tablet Take 1 tablet by mouth every 4 (four) hours as needed. Patient not taking: Reported on 01/10/2015 09/02/13   Ashley Murrain, NP  permethrin (ELIMITE) 5 % cream Apply to affected area once Patient not taking: Reported on 01/10/2015 12/30/13   Ashley Murrain, NP  predniSONE (DELTASONE) 10 MG tablet Take 2 tablets (20 mg total) by mouth 2 (two) times daily with a meal. Patient not taking: Reported on 01/10/2015 12/30/13   Ashley Murrain, NP      Allergies    Ibuprofen, Oxycontin [oxycodone hcl], and Penicillins    Review of Systems   Review of Systems  Constitutional:  Positive for chills and fever.  HENT:  Positive for congestion. Negative for sore throat.   Respiratory:  Positive for cough. Negative for shortness of breath.   Cardiovascular:  Negative for chest pain.  Gastrointestinal:  Negative for abdominal pain, diarrhea, nausea and vomiting.  Genitourinary:  Positive for decreased urine volume and difficulty urinating. Negative for dysuria, flank pain, penile swelling and scrotal swelling.  Neurological:  Negative for headaches.   Physical Exam Updated Vital Signs BP 131/82 (BP Location: Left Arm)    Pulse 75    Temp 98.1 F (36.7 C) (Oral)    Resp 18    SpO2 99%  Physical Exam Vitals and nursing note reviewed.  Constitutional:      General: He is not in acute distress.    Appearance: He is  not ill-appearing.  HENT:     Head: Normocephalic and atraumatic.     Right Ear: Tympanic membrane, ear canal and external ear normal.     Left Ear: Tympanic membrane, ear canal and external ear normal.     Nose: Congestion present.     Comments: Erythematous turbinates bilaterally    Mouth/Throat:     Mouth: Mucous membranes are moist.     Pharynx: No oropharyngeal exudate or posterior oropharyngeal erythema.     Comments: Cobblestoning on the posterior  pharynx Eyes:     Conjunctiva/sclera: Conjunctivae normal.  Cardiovascular:     Rate and Rhythm: Normal rate and regular rhythm.     Pulses: Normal pulses.     Heart sounds: No murmur heard.   No friction rub. No gallop.  Pulmonary:     Effort: No respiratory distress.     Breath sounds: No wheezing, rhonchi or rales.  Abdominal:     Palpations: Abdomen is soft.     Tenderness: There is no abdominal tenderness. There is no right CVA tenderness or left CVA tenderness.     Comments: Abdomen nondistended, normal bowel sounds, dull to percussion, slight bulge at his umbilicus, obvious palpable ventral hernia present my exam, abdomen is nontender to palpation, no guarding, rebound tenderness, peritoneal sign negative Murphy sign or McBurney point.  Musculoskeletal:     Right lower leg: No edema.     Left lower leg: No edema.  Skin:    General: Skin is warm and dry.  Neurological:     Mental Status: He is alert.  Psychiatric:        Mood and Affect: Mood normal.    ED Results / Procedures / Treatments   Labs (all labs ordered are listed, but only abnormal results are displayed) Labs Reviewed  URINALYSIS, ROUTINE W REFLEX MICROSCOPIC  GC/CHLAMYDIA PROBE AMP ([place]) NOT AT [facility]    EKG None  Radiology No results found.  Procedures Procedures    Medications Ordered in ED Medications - No data to display  ED Course/ Medical Decision Making/ A&P                           Medical Decision Making  This patient presents to the ED for concern of multiple complaints, this involves an extensive number of treatment options, and is a complaint that carries with it a high risk of complications and morbidity.  The differential diagnosis includes hernia, obstruction, pneumonia UTI    Additional history obtained:  Additional history obtained from electronic medical record, mother   Co morbidities that complicate the patient evaluation  N/A  Social Determinants of  Health:  N/A    Lab Tests:  I Ordered, and personally interpreted labs.  The pertinent results include: UA unremarkable   Imaging Studies ordered:  I ordered imaging studies including bladder scan I independently visualized and interpreted imaging which showed shows 42 mL in the bladder I agree with the radiologist interpretation   Test Considered:  Abdominal x-ray but a very low suspicion for obstruction at this time as it is nondistended, patient is having bowel movements and passing gas presentation atypical of etiology.    Rule out Low suspicion for systemic infection as patient is nontoxic-appearing, vital signs reassuring, no obvious source infection noted on exam.  Low suspicion for pneumonia as lung sounds are clear bilaterally, will defer x-ray at this time. I have low suspicion for PE as patient denies  pleuritic chest pain, shortness of breath, patient is PERC. low suspicion for strep throat as oropharynx was visualized, no erythema or exudates noted.  Low suspicion for UTI, Pilo, kidney stones as UA is negative for sign infection, he is no flank tenderness.  I have low suspicion for STI as he denies any testicular pain or penile discharge will obtain GC chlamydia but defer antibiotic treatment at this time till results come back.  I have low suspicion for incarcerated or strangulated hernia as there is no palpable or obvious hernia present my exam patient does have a surgical abdomen he still passing gas and having normal bowel movements very low suspicion for hernia at this time     Dispostion and problem list  After consideration of the diagnostic results and the patients response to treatment, I feel that the patent would benefit from   URI-likely viral nature, recommend symptom management, would not recommend antiviral treatment this time as he is outside the treatment window not immunocompromise, has very mild symptoms low risk factors for adverse outcome follow with  PCP as needed Decreased urination-unclear etiology possible he is dehydrated, UTI, STIs, kidney stone, obstruction we will have him follow-up with PCP for further evaluation.  Bulging umbilicus-unclear etiology possibly has a fat contained ventral hernia we will have her follow-up with PCP for further evaluation           Final Clinical Impression(s) / ED Diagnoses Final diagnoses:  Upper respiratory tract infection, unspecified type  Decreased urination    Rx / DC Orders ED Discharge Orders     None         Marcello Fennel, PA-C 06/06/21 2121    Godfrey Pick, MD 06/07/21 0230

## 2021-06-06 NOTE — Discharge Instructions (Signed)
Likely a viral infection, recommend over-the-counter pain medications like ibuprofen Tylenol for fever and pain control, nasal decongestions like Flonase and Zyrtec, Mucinex for cough.  If not eating recommend supplementing with Gatorade to help with electrolyte supplementation. Decreased urination-please continue stay hydrated, your gonorrhea and Chlamydia tests are pending at this time please follow-up with the results on your MyChart account like to follow-up with your PCP for further evaluation. Stomach bulge-possibly have a hernia you may follow-up with general surgery and/or your PCP for further evaluation.  Come back to the emergency department if you develop chest pain, shortness of breath, severe abdominal pain, uncontrolled nausea, vomiting, diarrhea.   Follow-up PCP for further evaluation.  Come back to the emergency department if you develop chest pain, shortness of breath, severe abdominal pain, uncontrolled nausea, vomiting, diarrhea.

## 2021-06-06 NOTE — ED Triage Notes (Signed)
Patient has multiples complaints

## 2021-06-08 LAB — GC/CHLAMYDIA PROBE AMP (~~LOC~~) NOT AT ARMC
Chlamydia: NEGATIVE
Comment: NEGATIVE
Comment: NORMAL
Neisseria Gonorrhea: NEGATIVE

## 2022-04-30 ENCOUNTER — Emergency Department (HOSPITAL_COMMUNITY): Payer: BC Managed Care – PPO

## 2022-04-30 ENCOUNTER — Emergency Department (HOSPITAL_COMMUNITY)
Admission: EM | Admit: 2022-04-30 | Discharge: 2022-04-30 | Disposition: A | Discharge status: Home / Self Care | Payer: BC Managed Care – PPO | Attending: Emergency Medicine | Admitting: Emergency Medicine

## 2022-04-30 ENCOUNTER — Other Ambulatory Visit: Payer: Self-pay

## 2022-04-30 DIAGNOSIS — W010XXA Fall on same level from slipping, tripping and stumbling without subsequent striking against object, initial encounter: Secondary | ICD-10-CM | POA: Diagnosis not present

## 2022-04-30 DIAGNOSIS — M79644 Pain in right finger(s): Secondary | ICD-10-CM | POA: Insufficient documentation

## 2022-04-30 NOTE — ED Provider Notes (Signed)
[facility] EMERGENCY DEPARTMENT Provider Note   CSN: [id number] Arrival date & time: 04/30/22  1112     History  Chief Complaint  Patient presents with   Jonathan Hobbs is a 31 y.o. male.   Fall   31 year old male presents emergency department with complaints of right pinky pain.  Patient states that he was walking his dog earlier today when his dog's shoe wrapped around his ankle causing him to fall.  He states that his right pinky caught the ground and "bent a weird way" and caused subsequent pain.  Presents emergency room for evaluation.  Denies weakness/sensory deficits in affected finger.  Reports taking ibuprofen this morning just prior to arrival which is helped his symptoms some.  Denies trauma to head, loss consciousness or anticoagulation use.  No significant pertinent past medical history.  Home Medications Prior to Admission medications   Medication Sig Start Date End Date Taking? Authorizing Provider  amoxicillin (AMOXIL) 500 MG capsule Take 1 capsule (500 mg total) by mouth 3 (three) times daily. Patient not taking: Reported on 01/10/2015 09/02/13   Janne Napoleon, NP  Aspirin-Caffeine [facility] FAST PAIN RELIEF PO) Take 1 Package by mouth daily as needed (pain).    [provider]  cyclobenzaprine (FLEXERIL) 10 MG tablet Take 1 tablet (10 mg total) by mouth 3 (three) times daily as needed for muscle spasms. 01/10/15   Ivery Quale, PA-C  diphenhydrAMINE (BENADRYL) 25 MG tablet Take 1 tablet (25 mg total) by mouth every 6 (six) hours. Patient not taking: Reported on 01/10/2015 12/30/13   Janne Napoleon, NP  HYDROcodone-acetaminophen (NORCO/VICODIN) 5-325 MG per tablet Take 1 tablet by mouth every 4 (four) hours as needed. Patient not taking: Reported on 01/10/2015 09/02/13   Janne Napoleon, NP  permethrin (ELIMITE) 5 % cream Apply to affected area once Patient not taking: Reported on 01/10/2015 12/30/13   Janne Napoleon, NP  predniSONE (DELTASONE) 10 MG tablet Take 2  tablets (20 mg total) by mouth 2 (two) times daily with a meal. Patient not taking: Reported on 01/10/2015 12/30/13   Janne Napoleon, NP      Allergies    Ibuprofen, Oxycontin [oxycodone hcl], and Penicillins    Review of Systems   Review of Systems  All other systems reviewed and are negative.   Physical Exam Updated Vital Signs BP (!) 132/91   Pulse 69   Temp 98.1 F (36.7 C) (Oral)   Resp 20   Ht 5\' 11"  (1.803 m)   Wt 61.2 kg   SpO2 98%   BMI 18.83 kg/m  Physical Exam Vitals and nursing note reviewed.  Constitutional:      General: He is not in acute distress.    Appearance: He is well-developed.  HENT:     Head: Normocephalic and atraumatic.  Eyes:     Conjunctiva/sclera: Conjunctivae normal.  Cardiovascular:     Rate and Rhythm: Normal rate and regular rhythm.     Heart sounds: No murmur heard. Pulmonary:     Effort: Pulmonary effort is normal. No respiratory distress.     Breath sounds: Normal breath sounds.  Abdominal:     Palpations: Abdomen is soft.     Tenderness: There is no abdominal tenderness.  Musculoskeletal:        General: No swelling.     Cervical back: Neck supple.     Comments: Patient has full range of motion of digits of right upper extremity in  flexion and extension.  Patient is tender to palpation of right fifth metacarpal phalangeal joint and proximal phalanx.  No overlying skin abnormalities noted.  No obvious deviation from baseline anatomy with compared to left hand.  Cap refill less than 2 seconds, radial pulses full intact bilaterally.  Patient complaining no sensory deficits distally.  No obvious breaks in skin.  Skin:    General: Skin is warm and dry.     Capillary Refill: Capillary refill takes less than 2 seconds.  Neurological:     Mental Status: He is alert.  Psychiatric:        Mood and Affect: Mood normal.     ED Results / Procedures / Treatments   Labs (all labs ordered are listed, but only abnormal results are  displayed) Labs Reviewed - No data to display  EKG None  Radiology DG Finger Little Right  Result Date: 04/30/2022 CLINICAL DATA:  Fall, pain EXAM: RIGHT LITTLE FINGER 2+V COMPARISON:  None Available. FINDINGS: There is no evidence of fracture or dislocation. There is no evidence of arthropathy or other focal bone abnormality. Soft tissues are unremarkable. IMPRESSION: No fracture or dislocation of the right fifth digit. Electronically Signed   By: Jearld Lesch M.D.   On: 04/30/2022 12:30    Procedures Procedures    Medications Ordered in ED Medications - No data to display  ED Course/ Medical Decision Making/ A&P                           Medical Decision Making Amount and/or Complexity of Data Reviewed Radiology: ordered.   This patient presents to the ED for concern of right finger pain, this involves an extensive number of treatment options, and is a complaint that carries with it a high risk of complications and morbidity.  The differential diagnosis includes fracture, strain/sprain, dislocation, neurovascular compromise, ligamentous/tendinous injury, osteoarthritis, rheumatoid arthritis,   Co morbidities that complicate the patient evaluation  See HPI   Additional history obtained:  Additional history obtained from EMR External records from outside source obtained and reviewed including hospital records   Lab Tests:  N/a   Imaging Studies ordered:  I ordered imaging studies including right pinky x-ray I independently visualized and interpreted imaging which showed no acute fracture or dislocation I agree with the radiologist interpretation   Cardiac Monitoring: / EKG:  The patient was maintained on a cardiac monitor.  I personally viewed and interpreted the cardiac monitored which showed an underlying rhythm of: Sinus rhythm   Consultations Obtained:  N/a   Problem List / ED Course / Critical interventions / Medication management  Right fifth  finger pain Reevaluation of the patient showed that the patient stayed the same I have reviewed the patients home medicines and have made adjustments as needed   Social Determinants of Health:  Denies illicit drug use.   Test / Admission - Considered:  Right fifth finger pain Vitals signs significant for mild hypertension with a blood pressure 132/91.  Recommend close follow-up with PCP regarding elevation blood pressure.. Otherwise within normal range and stable throughout visit. Imaging studies significant for: See above Patient without any acute abnormalities.  Given area of tenderness as well as self-reported visualization of anatomical abnormality by patient, digit could have been dislocated at the MCP joint with subsequent relocation.  No evidence of acute abnormalities or joint instability currently.  Patient has full range of motion.  Will be given static finger splint as  well as advised to buddy tape to ring finger for additional stability.  Follow-up with orthopedics recommended outpatient.  In the meantime, symptomatic therapy recommended with rest, ice, elevation, NSAID therapy.  Treatment plan discussed with patient and he acknowledged understanding and was agreeable to said plan. Worrisome signs and symptoms were discussed with the patient, and the patient acknowledged understanding to return to the ED if noticed. Patient was stable upon discharge.          Final Clinical Impression(s) / ED Diagnoses Final diagnoses:  Pain of finger of right hand    Rx / DC Orders ED Discharge Orders     None         Peter Garter, [place] 04/30/22 1426    Gloris Manchester, MD 05/01/22 [phone]

## 2022-04-30 NOTE — Discharge Instructions (Addendum)
Note the work-up today was overall reassuring.  Your x-ray showed no acute abnormalities.  As discussed, we are splint for additional protection as well as continued buddy taping fingers.  Follow-up with orthopedics as soon as you are able outpatient.  In the meantime, symptomatic therapy recommended with rest, ice, elevation and NSAIDs such as ibuprofen/Aleve.  Please not hesitate to return to emergency department if the worrisome signs and symptoms we discussed become apparent.

## 2022-04-30 NOTE — ED Triage Notes (Signed)
Pt tripped over a chain this morning, tried to catch self and had his right pinky finger bend back. Limited ROM to pinky finger. Denies hitting head with fall.

## 2023-04-28 ENCOUNTER — Emergency Department (HOSPITAL_COMMUNITY): Payer: BC Managed Care – PPO

## 2023-04-28 ENCOUNTER — Other Ambulatory Visit: Payer: Self-pay

## 2023-04-28 ENCOUNTER — Emergency Department (HOSPITAL_COMMUNITY)
Admission: EM | Admit: 2023-04-28 | Discharge: 2023-04-28 | Disposition: A | Discharge status: Home / Self Care | Payer: BC Managed Care – PPO | Attending: Emergency Medicine | Admitting: Emergency Medicine

## 2023-04-28 ENCOUNTER — Encounter (HOSPITAL_COMMUNITY): Payer: Self-pay

## 2023-04-28 DIAGNOSIS — S62316A Displaced fracture of base of fifth metacarpal bone, right hand, initial encounter for closed fracture: Secondary | ICD-10-CM | POA: Insufficient documentation

## 2023-04-28 DIAGNOSIS — S6991XA Unspecified injury of right wrist, hand and finger(s), initial encounter: Secondary | ICD-10-CM | POA: Diagnosis present

## 2023-04-28 DIAGNOSIS — W228XXA Striking against or struck by other objects, initial encounter: Secondary | ICD-10-CM | POA: Diagnosis not present

## 2023-04-28 MED ORDER — ACETAMINOPHEN 500 MG PO TABS
1000.0000 mg | ORAL_TABLET | Freq: Once | ORAL | Status: AC
  Administered 2023-04-28: 1000 mg via ORAL
  Filled 2023-04-28: qty 2

## 2023-04-28 MED ORDER — OXYCODONE HCL 5 MG PO TABS: 5.0000 mg | ORAL_TABLET | Freq: Four times a day (QID) | ORAL | 0 refills | Status: AC | PRN

## 2023-04-28 NOTE — Discharge Instructions (Addendum)
You have a fracture of your fifth metacarpal which is the bone in your hand right below your pinky finger.  You have been placed into a splint today.  Please follow-up with orthopedics within the next week for repeat x-rays and to transition into a cast. Please keep the splint dry by wrapping it with something such as a garbage bag when showering.  Take 1000mg  tylenol, then 3 hours later 600mg  ibuprofen, then 3 hours later 1000mg  tylenol, so on and so forth for pain control. You were given your first dose of tylenol here at 1:30pm.   You have been prescribed Oxycodone-this is a narcotic/controlled substance medication that has potential addicting qualities.  You may take 1 tablet every 6 hours as needed for severe pain not controlled with Tylenol ibuprofen.  Do not drive or operate heavy machinery when taking this medicine as it can be sedating. Do not drink alcohol or take other sedating medications when taking this medicine for safety reasons.  Keep this out of reach of small children.    Return to the ER for any uncontrolled or worsened pain, numbness in your fingers, any other new or concerning symptoms.

## 2023-04-28 NOTE — ED Provider Notes (Signed)
[place] EMERGENCY DEPARTMENT AT [facility] Provider Note   CSN: [id number] Arrival date & time: 04/28/23  1159     History  Chief Complaint  Patient presents with   Hand Injury    Jonathan Hobbs is a 32 y.o. male with no significant past medical history presents with concern for left hand pain after punching a wall yesterday.  He reports the pain is mostly by his left pinky finger.  Denies any numbness or tingling in the hand.  Took two 800 mg ibuprofen yesterday night without relief of pain.   Hand Injury      Home Medications Prior to Admission medications   Medication Sig Start Date End Date Taking? Authorizing Provider  oxyCODONE (ROXICODONE) 5 MG immediate release tablet Take 1 tablet (5 mg total) by mouth every 6 (six) hours as needed for up to 5 days for severe pain (pain score 7-10) (Pain not controlled with Tylenol and ibuprofen). 04/28/23 05/03/23 Yes Arabella Merles, PA-C  amoxicillin (AMOXIL) 500 MG capsule Take 1 capsule (500 mg total) by mouth 3 (three) times daily. Patient not taking: Reported on 01/10/2015 09/02/13   Janne Napoleon, NP  Aspirin-Caffeine [facility] FAST PAIN RELIEF PO) Take 1 Package by mouth daily as needed (pain).    [provider]  cyclobenzaprine (FLEXERIL) 10 MG tablet Take 1 tablet (10 mg total) by mouth 3 (three) times daily as needed for muscle spasms. 01/10/15   Ivery Quale, PA-C  diphenhydrAMINE (BENADRYL) 25 MG tablet Take 1 tablet (25 mg total) by mouth every 6 (six) hours. Patient not taking: Reported on 01/10/2015 12/30/13   Janne Napoleon, NP  permethrin (ELIMITE) 5 % cream Apply to affected area once Patient not taking: Reported on 01/10/2015 12/30/13   Janne Napoleon, NP  predniSONE (DELTASONE) 10 MG tablet Take 2 tablets (20 mg total) by mouth 2 (two) times daily with a meal. Patient not taking: Reported on 01/10/2015 12/30/13   Janne Napoleon, NP      Allergies    Ibuprofen, Oxycontin [oxycodone hcl], and Penicillins     Review of Systems   Review of Systems  Musculoskeletal:        Left hand pain    Physical Exam Updated Vital Signs BP (!) 154/89 (BP Location: Left Arm)   Pulse 73   Temp 98.5 F (36.9 C)   Resp 16   Ht 6' (1.829 m)   Wt 56.7 kg   SpO2 99%   BMI 16.95 kg/m  Physical Exam Vitals and nursing note reviewed.  Constitutional:      Appearance: Normal appearance.  HENT:     Head: Atraumatic.  Cardiovascular:     Comments: 2+ radial pulse bilaterally Pulmonary:     Effort: Pulmonary effort is normal.  Musculoskeletal:     Comments: No obvious deformity of the left hand.  There is moderate edema overlying the fifth metacarpal with slight ecchymoses Tender palpation over the fifth metacarpal base.  No tenderness palpation of the 1st through 4th metacarpals, or 1st through 5th phalanges of the left hand.  No tenderness palpation of the carpal bones, radius or ulna  Able to wiggle 1st through 5th digits of the left hand, able to flex and extend wrist without difficulty.    Neurological:     General: No focal deficit present.     Mental Status: He is alert.     Comments: Intact sensation of the 1st through 5th digits of the left hand  Psychiatric:        Mood and Affect: Mood normal.        Behavior: Behavior normal.     ED Results / Procedures / Treatments   Labs (all labs ordered are listed, but only abnormal results are displayed) Labs Reviewed - No data to display  EKG None  Radiology DG Hand Complete Right  Result Date: 04/28/2023 CLINICAL DATA:  Punching injury of right hand. EXAM: RIGHT HAND - COMPLETE 3+ VIEW COMPARISON:  None Available. FINDINGS: Acute fracture at the base of the right fifth metacarpal demonstrates mild displacement on the oblique view with up to 2 mm of separation towards the base of the metacarpal. No significant angulation on the lateral view. No associated dislocation. Associated overlying soft tissue swelling. No bony lesions or  arthropathy. IMPRESSION: Acute fracture at the base of the right fifth metacarpal with mild displacement. Electronically Signed   By: Irish Lack M.D.   On: 04/28/2023 12:45    Procedures Procedures    Medications Ordered in ED Medications  acetaminophen (TYLENOL) tablet 1,000 mg (has no administration in time range)    ED Course/ Medical Decision Making/ A&P                                 Medical Decision Making Amount and/or Complexity of Data Reviewed Radiology: ordered.  Risk OTC drugs. Prescription drug management.     Differential diagnosis includes but is not limited to fracture, dislocation, contusion, tendon injury  ED Course:  Patient with obvious edema over the left fifth metacarpal. No obvious deformity. X-rays demonstrate fracture at the base of the right fifth metacarpal with only mild displacement.  No open fracture. Patient neurovascularly intact. Able to move 1-5th digits of the right hand and right wrist without difficulty. He was placed in an ulnar gutter splint without complication. Remains neurovascularly intact after splint placement.  Patient given Tylenol for pain as he is going to be driving himself home.  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   Impression: Base of the right fifth metacarpal fracture  Disposition:  The patient was discharged home with instructions to keep splint dry and in place.  Follow-up with orthopedics within the next week for follow-up.  Tylenol and ibuprofen at home for pain.  Oxycodone for breakthrough pain. Return precautions given.   Imaging Studies ordered: I ordered imaging studies including  x ray right hand I independently visualized the imaging with scope of interpretation limited to determining acute life threatening conditions related to emergency care. Imaging showed mildly displaced fracture of the base of the right 5th metacarpal I agree with the  radiologist interpretation               Final Clinical Impression(s) / ED Diagnoses Final diagnoses:  Closed displaced fracture of base of fifth metacarpal bone of right hand, initial encounter    Rx / DC Orders ED Discharge Orders          Ordered    oxyCODONE (ROXICODONE) 5 MG immediate release tablet  Every 6 hours PRN        04/28/23 1306              Arabella Merles, PA-C 04/28/23 1319    Rondel Baton, MD 05/02/23 1749

## 2023-04-28 NOTE — ED Triage Notes (Addendum)
Pt reports he was punching a speed bag and thinks he broke his right hand.

## 2023-05-02 ENCOUNTER — Ambulatory Visit: Payer: BC Managed Care – PPO | Admitting: Orthopaedic Surgery

## 2023-05-08 ENCOUNTER — Encounter: Payer: Self-pay | Admitting: Orthopaedic Surgery

## 2023-05-08 ENCOUNTER — Ambulatory Visit (INDEPENDENT_AMBULATORY_CARE_PROVIDER_SITE_OTHER): Payer: BC Managed Care – PPO | Admitting: Orthopaedic Surgery

## 2023-05-08 VITALS — BP 170/107 | HR 86 | Ht 68.0 in | Wt 130.0 lb

## 2023-05-08 DIAGNOSIS — S62346A Nondisplaced fracture of base of fifth metacarpal bone, right hand, initial encounter for closed fracture: Secondary | ICD-10-CM | POA: Diagnosis not present

## 2023-05-08 MED ORDER — HYDROCODONE-ACETAMINOPHEN 5-325 MG PO TABS: ORAL_TABLET | ORAL | 0 refills | Status: DC

## 2023-05-08 NOTE — Progress Notes (Signed)
Subjective:    Patient ID: Jonathan Hobbs, male    DOB: 01/02/1991, 32 y.o.   MRN: [id number]  HPI He was boxing with a bag.  He hit steel beam and hurt his right dominant hand on April 27, 2023.  His hand continued to hurt and he went to ER.  X-rays showed fracture base of the right fifth metacarpal.  He was put into a splint.  He has no other injury.  He is doing well.   Review of Systems  Constitutional:  Positive for activity change.  Respiratory:  Positive for shortness of breath.   Musculoskeletal:  Positive for arthralgias and joint swelling.  All other systems reviewed and are negative. For Review of Systems, all other systems reviewed and are negative.  The following is a summary of the past history medically, past history surgically, known current medicines, social history and family history.  This information is gathered electronically by the computer from prior information and documentation.  I review this each visit and have found including this information at this point in the chart is beneficial and informative.   Past Medical History:  Diagnosis Date   Asthma     History reviewed. No pertinent surgical history.  Current Outpatient Medications on File Prior to Visit  Medication Sig Dispense Refill   amoxicillin (AMOXIL) 500 MG capsule Take 1 capsule (500 mg total) by mouth 3 (three) times daily. (Patient not taking: Reported on 01/10/2015) 21 capsule 0   Aspirin-Caffeine (BC FAST PAIN RELIEF PO) Take 1 Package by mouth daily as needed (pain). (Patient not taking: Reported on 05/08/2023)     cyclobenzaprine (FLEXERIL) 10 MG tablet Take 1 tablet (10 mg total) by mouth 3 (three) times daily as needed for muscle spasms. (Patient not taking: Reported on 05/08/2023) 20 tablet 0   diphenhydrAMINE (BENADRYL) 25 MG tablet Take 1 tablet (25 mg total) by mouth every 6 (six) hours. (Patient not taking: Reported on 01/10/2015) 20 tablet 0   permethrin (ELIMITE) 5 % cream Apply to  affected area once (Patient not taking: Reported on 01/10/2015) 60 g 0   predniSONE (DELTASONE) 10 MG tablet Take 2 tablets (20 mg total) by mouth 2 (two) times daily with a meal. (Patient not taking: Reported on 01/10/2015) 16 tablet 0   No current facility-administered medications on file prior to visit.    Social History   Socioeconomic History   Marital status: Single    Spouse name: Not on file   Number of children: Not on file   Years of education: Not on file   Highest education level: Not on file  Occupational History   Not on file  Tobacco Use   Smoking status: Every Day    Current packs/day: 0.50    Average packs/day: 0.5 packs/day for 7.0 years (3.5 ttl pk-yrs)    Types: Cigarettes   Smokeless tobacco: Never  Vaping Use   Vaping status: Never Used  Substance and Sexual Activity   Alcohol use: Not Currently    Comment: Occ   Drug use: Not Currently    Types: Marijuana   Sexual activity: Yes  Other Topics Concern   Not on file  Social History Narrative   Not on file   Social Determinants of Health   Financial Resource Strain: Not on file  Food Insecurity: Not on file  Transportation Needs: Not on file  Physical Activity: Not on file  Stress: Not on file  Social Connections: Not on file  Intimate  Partner Violence: Not on file    Family History  Problem Relation Age of Onset   Cancer Other    Diabetes Other     BP (!) 170/107   Pulse 86   Ht 5\' 8"  (1.727 m)   Wt 130 lb (59 kg)   BMI 19.77 kg/m   Body mass index is 19.77 kg/m.      Objective:   Physical Exam Vitals and nursing note reviewed. Exam conducted with a chaperone present.  Constitutional:      Appearance: He is well-developed.  HENT:     Head: Normocephalic and atraumatic.  Eyes:     Conjunctiva/sclera: Conjunctivae normal.     Pupils: Pupils are equal, round, and reactive to light.  Cardiovascular:     Rate and Rhythm: Normal rate and regular rhythm.  Pulmonary:     Effort:  Pulmonary effort is normal.  Abdominal:     Palpations: Abdomen is soft.  Musculoskeletal:       Hands:     Cervical back: Normal range of motion and neck supple.  Skin:    General: Skin is warm and dry.  Neurological:     Mental Status: He is alert and oriented to person, place, and time.     Cranial Nerves: No cranial nerve deficit.     Motor: No abnormal muscle tone.     Coordination: Coordination normal.     Deep Tendon Reflexes: Reflexes are normal and symmetric. Reflexes normal.  Psychiatric:        Behavior: Behavior normal.        Thought Content: Thought content normal.        Judgment: Judgment normal.      I have reviewed the ER notes and X-rays.  I have independently reviewed and interpreted x-rays of this patient done at another site by another physician or qualified health professional.      Assessment & Plan:   Encounter Diagnosis  Name Primary?   Closed nondisplaced fracture of base of fifth metacarpal bone of right hand, initial encounter Yes   A splint was applied.  Return in one week.  X-rays out of the splint.  I have reviewed the [place] Controlled Substance Reporting System web site prior to prescribing narcotic medicine for this patient.  Call if any problem.  Precautions discussed.  Electronically Signed Darreld Mclean, MD 12/11/20248:37 AM

## 2023-05-15 ENCOUNTER — Encounter: Payer: Self-pay | Admitting: Orthopaedic Surgery

## 2023-05-15 ENCOUNTER — Other Ambulatory Visit (INDEPENDENT_AMBULATORY_CARE_PROVIDER_SITE_OTHER): Payer: BC Managed Care – PPO

## 2023-05-15 ENCOUNTER — Telehealth: Payer: Self-pay | Admitting: Orthopaedic Surgery

## 2023-05-15 ENCOUNTER — Ambulatory Visit (INDEPENDENT_AMBULATORY_CARE_PROVIDER_SITE_OTHER): Payer: BC Managed Care – PPO | Admitting: Orthopaedic Surgery

## 2023-05-15 VITALS — Ht 69.0 in | Wt 125.0 lb

## 2023-05-15 DIAGNOSIS — S62346A Nondisplaced fracture of base of fifth metacarpal bone, right hand, initial encounter for closed fracture: Secondary | ICD-10-CM

## 2023-05-15 DIAGNOSIS — S62346D Nondisplaced fracture of base of fifth metacarpal bone, right hand, subsequent encounter for fracture with routine healing: Secondary | ICD-10-CM

## 2023-05-15 NOTE — Telephone Encounter (Signed)
Dr. Sanjuan Dame pt - pt lvm stating that he just left from the office and that we had asked him what pharmacy he uses.  He stated that he uses [facility] and that a script was sent in last week and he still hasn't been able to get it. [phone]

## 2023-05-15 NOTE — Telephone Encounter (Signed)
FMLA forms received. please advise of patients work status. Thank you!

## 2023-05-15 NOTE — Telephone Encounter (Signed)
Called patient and advised him that Dr Hilda Lias sent in his medication to [facility] and if its a problem he needs to contact the pharmacy to see what's wrong

## 2023-05-15 NOTE — Telephone Encounter (Signed)
Noted for [employer]. 

## 2023-05-15 NOTE — Progress Notes (Signed)
My hand is better.  He had X-rays out of the splint.  The fracture is healing well.  X-rays were done of the right hand, reported separately.  NV intact.  He says he would do better in a fixed splint and going to a cock-up splint.  A new short arm ulnar gutter splint applied.  Encounter Diagnosis  Name Primary?   Closed nondisplaced fracture of base of fifth metacarpal bone of right hand with routine healing, subsequent encounter Yes   Return in three weeks.  I will give cock-up splint for him to begin in two weeks.  X-rays on return of the right hand.  Call if any problem.  Precautions discussed.  Electronically Signed Darreld Mclean, MD 12/18/202410:03 AM

## 2023-05-27 ENCOUNTER — Telehealth: Payer: Self-pay | Admitting: Orthopaedic Surgery

## 2023-05-27 NOTE — Telephone Encounter (Signed)
Pt came in today to drop off Auth form and $25 for [employer]

## 2023-05-30 ENCOUNTER — Telehealth: Payer: Self-pay | Admitting: Orthopaedic Surgery

## 2023-05-30 NOTE — Telephone Encounter (Signed)
 Dr. Janae pt - spoke w/the pt, he stated he woke up w/his hand swollen, he wants to know what to do.  Also, he stated that he was seen 12/11 and he still hasn't gotten his pain medication that Dr. Brenna sent in.  Hydrocodone  5-325, 30 tablets, One tablet every four hours as needed for acute pain.  Limit of five days per [place].  He stated that the pharmacy said our office has to call to get it approved.  [phone]

## 2023-06-04 NOTE — Telephone Encounter (Signed)
 Called pt and lvm advising

## 2023-06-05 ENCOUNTER — Encounter: Payer: Self-pay | Admitting: Orthopaedic Surgery

## 2023-06-05 ENCOUNTER — Other Ambulatory Visit (INDEPENDENT_AMBULATORY_CARE_PROVIDER_SITE_OTHER): Payer: Self-pay

## 2023-06-05 ENCOUNTER — Ambulatory Visit: Payer: BC Managed Care – PPO | Admitting: Orthopaedic Surgery

## 2023-06-05 DIAGNOSIS — S62346D Nondisplaced fracture of base of fifth metacarpal bone, right hand, subsequent encounter for fracture with routine healing: Secondary | ICD-10-CM

## 2023-06-05 NOTE — Progress Notes (Addendum)
 My hand is sore.  He has good motion of the right hand but the little finger may have some slight radial rotation to it.  NV intact.  I have explained exercises to do.  I will give cock-up splint.  X-rays were done of the right hand, reported separately.  Encounter Diagnosis  Name Primary?   Closed nondisplaced fracture of base of fifth metacarpal bone of right hand with routine healing, subsequent encounter Yes   Return in two weeks.  X-rays on return.  Call if any problem.  Precautions discussed.  Electronically Signed Lemond Stable, MD 1/8/20259:52 AM  He did not like the cock-up splint.  I have given a [place] splint and he likes that.  He was shown how to use.  Electronically Signed Lemond Stable, MD 1/8/202510:32 AM

## 2023-06-06 ENCOUNTER — Other Ambulatory Visit: Payer: Self-pay

## 2023-06-06 ENCOUNTER — Emergency Department (HOSPITAL_COMMUNITY)
Admission: EM | Admit: 2023-06-06 | Discharge: 2023-06-06 | Disposition: A | Discharge status: Home / Self Care | Payer: BC Managed Care – PPO | Attending: Emergency Medicine | Admitting: Emergency Medicine

## 2023-06-06 ENCOUNTER — Encounter (HOSPITAL_COMMUNITY): Payer: Self-pay | Admitting: *Deleted

## 2023-06-06 DIAGNOSIS — L02412 Cutaneous abscess of left axilla: Secondary | ICD-10-CM | POA: Insufficient documentation

## 2023-06-06 MED ORDER — DOXYCYCLINE HYCLATE 100 MG PO CAPS: 100.0000 mg | ORAL_CAPSULE | Freq: Two times a day (BID) | ORAL | 0 refills | Status: AC

## 2023-06-06 MED ORDER — DOXYCYCLINE HYCLATE 100 MG PO TABS
100.0000 mg | ORAL_TABLET | Freq: Once | ORAL | Status: AC
  Administered 2023-06-06: 100 mg via ORAL
  Filled 2023-06-06: qty 1

## 2023-06-06 MED ORDER — KETOROLAC TROMETHAMINE 15 MG/ML IJ SOLN
15.0000 mg | Freq: Once | INTRAMUSCULAR | Status: AC
  Administered 2023-06-06: 15 mg via INTRAMUSCULAR
  Filled 2023-06-06: qty 1

## 2023-06-06 NOTE — ED Provider Notes (Addendum)
 [place] EMERGENCY DEPARTMENT AT [facility] Provider Note   CSN: [id number] Arrival date & time: 06/06/23  1354     History  Chief Complaint  Patient presents with   Abscess    ROWEN WILMER is a 33 y.o. male. STERLIN KNIGHTLY is a 33 y.o. male. Complaining of several days of left axillary abscess, having some drainage to this area.  States it started as a small bump and then grew to about the size of a marble.  He states it is painful to lift his arm, denies fever or chills.  Denies prior episodes of this.  He does not have history of hidradenitis suppurativa denies IV drug use, he admits to tobacco use and occasional alcohol use   Abscess      Home Medications Prior to Admission medications   Medication Sig Start Date End Date Taking? Authorizing Provider  doxycycline  (VIBRAMYCIN ) 100 MG capsule Take 1 capsule (100 mg total) by mouth 2 (two) times daily for 7 days. 06/06/23 06/13/23 Yes Loyola Santino A, PA-C  HYDROcodone -acetaminophen  (NORCO/VICODIN) 5-325 MG tablet One tablet every four hours as needed for acute pain.  Limit of five days per [place]. 05/08/23   Brenna Lin, MD      Allergies    Ibuprofen, Oxycontin  [oxycodone  hcl], and Penicillins    Review of Systems   Review of Systems  Physical Exam Updated Vital Signs BP 137/86 (BP Location: Right Arm)   Pulse 75   Temp 98.9 F (37.2 C) (Oral)   Resp 16   Ht 5' 9 (1.753 m)   Wt 59 kg   SpO2 100%   BMI 19.20 kg/m  Physical Exam Vitals and nursing note reviewed.  Constitutional:      General: He is not in acute distress.    Appearance: He is well-developed.  HENT:     Head: Normocephalic and atraumatic.  Eyes:     Extraocular Movements: Extraocular movements intact.     Conjunctiva/sclera: Conjunctivae normal.     Pupils: Pupils are equal, round, and reactive to light.  Cardiovascular:     Rate and Rhythm: Normal rate and regular rhythm.     Heart sounds: No murmur  heard. Pulmonary:     Effort: Pulmonary effort is normal. No respiratory distress.     Breath sounds: Normal breath sounds.  Abdominal:     Palpations: Abdomen is soft.     Tenderness: There is no abdominal tenderness.  Musculoskeletal:        General: No swelling.     Cervical back: Neck supple.  Skin:    General: Skin is warm and dry.     Capillary Refill: Capillary refill takes less than 2 seconds.     Comments: 1 cm diameter draining abscess with minimal surrounding erythema   Neurological:     General: No focal deficit present.     Mental Status: He is alert and oriented to person, place, and time.  Psychiatric:        Mood and Affect: Mood normal.     ED Results / Procedures / Treatments   Labs (all labs ordered are listed, but only abnormal results are displayed) Labs Reviewed - No data to display  EKG None  Radiology   Procedures Procedures    Medications Ordered in ED Medications  doxycycline  (VIBRA -TABS) tablet 100 mg (100 mg Oral Given 06/06/23 1601)  ketorolac  (TORADOL ) 15 MG/ML injection 15 mg (15 mg Intramuscular Given 06/06/23 1600)  ED Course/ Medical Decision Making/ A&P                                 Medical Decision Making This patient presents to the ED for concern of left axillary abscess with active drainage, this involves an extensive number of treatment options, and is a complaint that carries with it a high risk of complications and morbidity.  The differential diagnosis includes abscess, cellulitis, lymphadenitis, other    Additional history obtained:  Additional history obtained from EMR External records from outside source obtained and reviewed including prior notes  Problem List / ED Course / Critical interventions / Medication management  Left axillary abscess-actively draining, able to express a moderate amount of purulent discharge.  Patient has no systemic symptoms such as fever or chills.  He is not tachycardic or febrile in  the ED.  Discussed with him plan to use warm compresses to continue encouraging drainage, started on doxycycline , follow-up closely with PCP or urgent care in 2 days for recheck, come back if he develops fever, increasing redness, fevers or other worrisome changes  I have reviewed the patients home medicines and have made adjustments as needed     Risk Prescription drug management.           Final Clinical Impression(s) / ED Diagnoses Final diagnoses:  Abscess of left axilla    Rx / DC Orders ED Discharge Orders          Ordered    doxycycline  (VIBRAMYCIN ) 100 MG capsule  2 times daily        06/06/23 [address], PA-C 06/06/23 1630    [address], PA-C 06/06/23 1633    Yolande Lamar BROCKS, MD 06/11/23 1223

## 2023-06-06 NOTE — ED Triage Notes (Signed)
 Pt with abscess to left axillary since Saturday, denies any drainage.  Pt denies any N/V or chills/fevers.

## 2023-06-19 ENCOUNTER — Ambulatory Visit (INDEPENDENT_AMBULATORY_CARE_PROVIDER_SITE_OTHER): Payer: BC Managed Care – PPO | Admitting: Orthopaedic Surgery

## 2023-06-19 ENCOUNTER — Encounter: Payer: Self-pay | Admitting: Orthopaedic Surgery

## 2023-06-19 ENCOUNTER — Other Ambulatory Visit (INDEPENDENT_AMBULATORY_CARE_PROVIDER_SITE_OTHER): Payer: BC Managed Care – PPO

## 2023-06-19 ENCOUNTER — Telehealth: Payer: Self-pay | Admitting: Orthopaedic Surgery

## 2023-06-19 DIAGNOSIS — S62346D Nondisplaced fracture of base of fifth metacarpal bone, right hand, subsequent encounter for fracture with routine healing: Secondary | ICD-10-CM

## 2023-06-19 NOTE — Progress Notes (Signed)
I feel better.  He has less pain to the right hand.  He has some slight rotary changes of the little finger rotating inward to the middle finger.  NV intact.  X-rays were done of the right hand, reported separately.  Encounter Diagnosis  Name Primary?   Closed nondisplaced fracture of base of fifth metacarpal bone of right hand with routine healing, subsequent encounter Yes   I will keep him out of work until February 5.  Return in two weeks. X-rays then.  He can come out of the [facility] splint as tolerated.  Call if any problem.  Precautions discussed.  Electronically Signed Darreld Mclean, MD 1/22/20259:40 AM

## 2023-06-19 NOTE — Telephone Encounter (Signed)
 [employer] forms received. To [employer].

## 2023-06-19 NOTE — Patient Instructions (Signed)
Continue out of work until next visit

## 2023-07-03 ENCOUNTER — Ambulatory Visit (INDEPENDENT_AMBULATORY_CARE_PROVIDER_SITE_OTHER): Payer: BC Managed Care – PPO | Admitting: Orthopaedic Surgery

## 2023-07-03 ENCOUNTER — Encounter: Payer: Self-pay | Admitting: Orthopaedic Surgery

## 2023-07-03 ENCOUNTER — Other Ambulatory Visit (INDEPENDENT_AMBULATORY_CARE_PROVIDER_SITE_OTHER): Payer: BC Managed Care – PPO

## 2023-07-03 DIAGNOSIS — S62346D Nondisplaced fracture of base of fifth metacarpal bone, right hand, subsequent encounter for fracture with routine healing: Secondary | ICD-10-CM

## 2023-07-03 MED ORDER — HYDROCODONE-ACETAMINOPHEN 5-325 MG PO TABS: ORAL_TABLET | ORAL | 0 refills | Status: DC

## 2023-07-03 NOTE — Progress Notes (Signed)
 My hand is sore  He has more tenderness of the fifth metacarpal head and lateral hand.  He has no swelling, no trauma.  X-rays were done of the right hand, reported separately.  Fracture has healed.  ROM is good but tender. The slight rotation of the little finger is better. He is very tender over the fifth metacarpal.  Encounter Diagnosis  Name Primary?   Closed nondisplaced fracture of base of fifth metacarpal bone of right hand with routine healing, subsequent encounter Yes   I will set up OT for the hand.  I have reviewed the [place]  Controlled Substance Reporting System web site prior to prescribing narcotic medicine for this patient.  Return in three weeks.  Call if any problem.  Precautions discussed.  Electronically Signed Lemond Stable, MD 2/5/20259:16 AM

## 2023-07-03 NOTE — Patient Instructions (Signed)
 Physical therapy has been ordered for you at [facility]. They should call you to schedule, [phone] is the phone number to call, if you want to call to schedule.

## 2023-07-05 ENCOUNTER — Telehealth: Payer: Self-pay | Admitting: Orthopaedic Surgery

## 2023-07-05 NOTE — Telephone Encounter (Signed)
 [employer] forms received. To [employer].

## 2023-07-11 ENCOUNTER — Ambulatory Visit (HOSPITAL_COMMUNITY): Payer: BC Managed Care – PPO | Attending: Orthopaedic Surgery | Admitting: Occupational Therapy

## 2023-07-11 ENCOUNTER — Ambulatory Visit (HOSPITAL_COMMUNITY): Payer: BC Managed Care – PPO

## 2023-07-11 DIAGNOSIS — M25641 Stiffness of right hand, not elsewhere classified: Secondary | ICD-10-CM | POA: Diagnosis present

## 2023-07-11 DIAGNOSIS — R29898 Other symptoms and signs involving the musculoskeletal system: Secondary | ICD-10-CM | POA: Diagnosis present

## 2023-07-11 DIAGNOSIS — M79644 Pain in right finger(s): Secondary | ICD-10-CM | POA: Insufficient documentation

## 2023-07-11 DIAGNOSIS — S62346D Nondisplaced fracture of base of fifth metacarpal bone, right hand, subsequent encounter for fracture with routine healing: Secondary | ICD-10-CM | POA: Diagnosis not present

## 2023-07-11 NOTE — Therapy (Signed)
 OUTPATIENT OCCUPATIONAL THERAPY ORTHO EVALUATION  Patient Name: Jonathan Hobbs MRN: [id number] DOB:04-Jan-1991, 33 y.o., male Today's Date: 07/15/2023  PCP: No PCP REFERRING PROVIDER: Darreld Mclean, MD  END OF SESSION:  OT End of Session - 07/15/23 1130     Visit Number 1    Number of Visits 5    Date for OT Re-Evaluation 08/16/23    Authorization Type [employer] (requesting 4 visits)    OT Start Time 1315    OT Stop Time 1353    OT Time Calculation (min) 38 min    Activity Tolerance Patient tolerated treatment well    Behavior During Therapy WFL for tasks assessed/performed             Past Medical History:  Diagnosis Date   Asthma    No past surgical history on file. There are no active problems to display for this patient.   ONSET DATE: ~December 1st, 2024  REFERRING DIAG: S62.346D (ICD-10-CM) - Closed nondisplaced fracture of base of fifth metacarpal bone of right hand with routine healing, subsequent encounter   THERAPY DIAG:  Pain in right finger(s)  Stiffness of finger joint of right hand  Other symptoms and signs involving the musculoskeletal system  Rationale for Evaluation and Treatment: Rehabilitation  SUBJECTIVE:   SUBJECTIVE STATEMENT: "The doctor is still talking about surgery." Pt accompanied by: self  PERTINENT HISTORY: Pt was boxing and fractured his R 5th metacarpal bone. He maintained a splint for 4 weeks, with continued healing of the R hand.   PRECAUTIONS: None  WEIGHT BEARING RESTRICTIONS: No  PAIN:  Are you having pain? Yes: NPRS scale: 3-4/10 Pain location: MCP of 5th digit Pain description: tender Aggravating factors: movement/gripping Relieving factors: medication  FALLS: Has patient fallen in last 6 months? No  PLOF: Independent  PATIENT GOALS: "I want to be able to move my hand like I used to"  NEXT MD VISIT: 07/24/23  OBJECTIVE:  Note: Objective measures were completed at Evaluation unless otherwise noted.  HAND  DOMINANCE: Right  ADLs: Overall ADLs: Pt has mod to max difficulty with manipulating buttons and zippers, as well as fixing/maintaining his hair. Pt only able to hold his coffee cup with LUE or short 10-15 seconds with RUE.   FUNCTIONAL OUTCOME MEASURES: Quick Dash: 43.18  UPPER EXTREMITY ROM:     Active ROM Right eval  Thumb MCP (0-60)   Thumb IP (0-80)   Thumb Radial abd/add (0-55)   Thumb Palmar abd/add (0-45)   Thumb Opposition to Small Finger   Index MCP (0-90)   Index PIP (0-100)   Index DIP (0-70)    Long MCP (0-90)    Long PIP (0-100)    Long DIP (0-70)    Ring MCP (0-90)  75  Ring PIP (0-100)  90  Ring DIP (0-70)  45  Little MCP (0-90) 65   Little PIP (0-100)  80  Little DIP (0-70)  50  (Blank rows = not tested)  HAND FUNCTION: Grip strength: Right: 56 lbs; Left: 118 lbs, Lateral pinch: Right: 7 lbs, Left: 20 lbs, and 3 point pinch: Right: 7 lbs, Left: 19 lbs  COORDINATION: 9 Hole Peg test: Right: 28.77 sec; Left: 20.46 sec  SENSATION: WFL  EDEMA: Mild noted in ulnar side of palm  OBSERVATIONS: Fingers rotating to the R side in a fist, small lump/hump noted along the ulnar aspect of hand   TREATMENT DATE:   07/11/23: -Evaluation -Measurements -Digit ROM: composite flexion, abduction/adduction, opposition, finger taps,  x10                                                                                                                               PATIENT EDUCATION: Education details: Digit ROM Person educated: Patient Education method: Explanation, Demonstration, and Handouts Education comprehension: verbalized understanding and returned demonstration  HOME EXERCISE PROGRAM: 2/13: Digit ROM  GOALS: Goals reviewed with patient? Yes  SHORT TERM GOALS: Target date: 08/16/23  Pt will be provided comprehensive HEP for RUE mobility in order to complete independent ADL's and IADL's.  Goal status: INITIAL  2.  Pt will decrease pain in RUE to 2/10  in order to sleep for 3+ consecutive hours without waking due to pain.  Goal status: INITIAL  3.  Pt will improve digit ROM in 4th and 5th RUE digits by 10 degrees or more in order to achieve a full fist needed for picking up and grasping items during child care.   Goal status: INITIAL  4.  Pt will improve RUE grip strength by 30# and Pinch strength by 4# in order to grasp, lift, and carry items needed during cooking/cleaning/yard work tasks.  Goal status: INITIAL  5.  Pt will improve RUE coordination by completing 9 hole peg test in 21" or less, in order to manipulate buttons, zippers, and laces for both his own dressing and assisting his child in dressing.   Goal status: INITIAL   ASSESSMENT:  CLINICAL IMPRESSION: Patient is a 33 y.o. male who was seen today for occupational therapy evaluation for R 5th digit MCP fracture. He presents with increased pain, mild ROM deficits, weak grip and pinch, as well as decreased coordination.   PERFORMANCE DEFICITS: in functional skills including ADLs, IADLs, coordination, sensation, ROM, strength, pain, fascial restrictions, Fine motor control, body mechanics, and UE functional use.  IMPAIRMENTS: are limiting patient from ADLs, IADLs, rest and sleep, work, leisure, and social participation.   COMORBIDITIES: has no other co-morbidities that affects occupational performance. Patient will benefit from skilled OT to address above impairments and improve overall function.  MODIFICATION OR ASSISTANCE TO COMPLETE EVALUATION: No modification of tasks or assist necessary to complete an evaluation.  OT OCCUPATIONAL PROFILE AND HISTORY: Problem focused assessment: Including review of records relating to presenting problem.  CLINICAL DECISION MAKING: LOW - limited treatment options, no task modification necessary  REHAB POTENTIAL: Good  EVALUATION COMPLEXITY: Low      PLAN:  OT FREQUENCY: 1x/week  OT DURATION: 4 weeks  PLANNED INTERVENTIONS:  97168 OT Re-evaluation, 97535 self care/ADL training, 40981 therapeutic exercise, 97530 therapeutic activity, 97112 neuromuscular re-education, 97140 manual therapy, 97018 paraffin, 19147 moist heat, 97032 electrical stimulation (manual), passive range of motion, functional mobility training, energy conservation, coping strategies training, patient/family education, and DME and/or AE instructions  RECOMMENDED OTHER SERVICES: Hand Specialist  CONSULTED AND AGREED WITH PLAN OF CARE: Patient  PLAN FOR NEXT SESSION: Trial Manual therapy, Digit ROM, stretches   Trish Mage, OTR/L  [facility] [phone] Bing Plume, OT 07/15/2023, 11:33 AM    Managed Medicaid Authorization Request  Visit Dx Codes: G95.621, M25.641, R29.898  Functional Tool Score: Neldon Mc - 43.18  For all possible CPT codes, reference the Planned Interventions line above.     Check all conditions that are expected to impact treatment: {Conditions expected to impact treatment:None of these apply   If treatment provided at initial evaluation, no treatment charged due to lack of authorization.

## 2023-07-17 ENCOUNTER — Encounter (HOSPITAL_COMMUNITY): Payer: Self-pay | Admitting: Occupational Therapy

## 2023-07-17 ENCOUNTER — Ambulatory Visit (HOSPITAL_COMMUNITY): Payer: BC Managed Care – PPO | Admitting: Occupational Therapy

## 2023-07-17 DIAGNOSIS — M79644 Pain in right finger(s): Secondary | ICD-10-CM

## 2023-07-17 DIAGNOSIS — R29898 Other symptoms and signs involving the musculoskeletal system: Secondary | ICD-10-CM

## 2023-07-17 DIAGNOSIS — M25641 Stiffness of right hand, not elsewhere classified: Secondary | ICD-10-CM

## 2023-07-17 NOTE — Therapy (Signed)
 OUTPATIENT OCCUPATIONAL THERAPY ORTHO TREATMENT NOTE  Patient Name: Jonathan Hobbs MRN: [id number] DOB:21-Aug-1990, 33 y.o., male Today's Date: 07/17/2023  PCP: No PCP REFERRING PROVIDER: Darreld Mclean, MD  END OF SESSION:  OT End of Session - 07/17/23 1019     Visit Number 2    Number of Visits 5    Date for OT Re-Evaluation 08/16/23    Authorization Type BCBS    Authorization Time Period No Auth needed    OT Start Time 0934    OT Stop Time 1019    OT Time Calculation (min) 45 min    Activity Tolerance Patient tolerated treatment well    Behavior During Therapy WFL for tasks assessed/performed             Past Medical History:  Diagnosis Date   Asthma    History reviewed. No pertinent surgical history. There are no active problems to display for this patient.   ONSET DATE: ~December 1st, 2024  REFERRING DIAG: S62.346D (ICD-10-CM) - Closed nondisplaced fracture of base of fifth metacarpal bone of right hand with routine healing, subsequent encounter   THERAPY DIAG:  Pain in right finger(s)  Stiffness of finger joint of right hand  Other symptoms and signs involving the musculoskeletal system  Rationale for Evaluation and Treatment: Rehabilitation  SUBJECTIVE:   SUBJECTIVE STATEMENT: "I had a rough night last night" Pt accompanied by: self  PERTINENT HISTORY: Pt was boxing and fractured his R 5th metacarpal bone. He maintained a splint for 4 weeks, with continued healing of the R hand.   PRECAUTIONS: None  WEIGHT BEARING RESTRICTIONS: No  PAIN:  Are you having pain? Yes: NPRS scale: 7/10 Pain location: MCP of 5th digit Pain description: tender Aggravating factors: movement/gripping Relieving factors: medication  FALLS: Has patient fallen in last 6 months? No  PLOF: Independent  PATIENT GOALS: "I want to be able to move my hand like I used to"  NEXT MD VISIT: 07/24/23  OBJECTIVE:  Note: Objective measures were completed at Evaluation unless  otherwise noted.  HAND DOMINANCE: Right  ADLs: Overall ADLs: Pt has mod to max difficulty with manipulating buttons and zippers, as well as fixing/maintaining his hair. Pt only able to hold his coffee cup with LUE or short 10-15 seconds with RUE.   FUNCTIONAL OUTCOME MEASURES: Quick Dash: 43.18  UPPER EXTREMITY ROM:     Active ROM Right eval  Thumb MCP (0-60)   Thumb IP (0-80)   Thumb Radial abd/add (0-55)   Thumb Palmar abd/add (0-45)   Thumb Opposition to Small Finger   Index MCP (0-90)   Index PIP (0-100)   Index DIP (0-70)    Long MCP (0-90)    Long PIP (0-100)    Long DIP (0-70)    Ring MCP (0-90)  75  Ring PIP (0-100)  90  Ring DIP (0-70)  45  Little MCP (0-90) 65   Little PIP (0-100)  80  Little DIP (0-70)  50  (Blank rows = not tested)  HAND FUNCTION: Grip strength: Right: 56 lbs; Left: 118 lbs, Lateral pinch: Right: 7 lbs, Left: 20 lbs, and 3 point pinch: Right: 7 lbs, Left: 19 lbs  COORDINATION: 9 Hole Peg test: Right: 28.77 sec; Left: 20.46 sec  SENSATION: WFL  EDEMA: Mild noted in ulnar side of palm  OBSERVATIONS: Fingers rotating to the R side in a fist, small lump/hump noted along the ulnar aspect of hand   TREATMENT DATE:   07/17/23 -Manual Therapy: myofascial  release and trigger point along ulnar aspect of wrist, hand, and pinky finger, in order to reduce pain and fascial restrictions, as well as improve ROM.  -Digit ROM: composite flexion, abduction/adduction, opposition, finger taps, x10 -Towel crunches x8 -Wringing out large towel roll x10, wringing out medium towel roll x10 -Wrist ROM: flexion, extension, ulnar/radial deviation, supination/pronation, x10  07/11/23: -Evaluation -Measurements -Digit ROM: composite flexion, abduction/adduction, opposition, finger taps, x10                                                                                                                               PATIENT EDUCATION: Education details:  Towel Crunches and towel roll gripping Person educated: Patient Education method: Explanation, Demonstration, and Handouts Education comprehension: verbalized understanding and returned demonstration  HOME EXERCISE PROGRAM: 2/13: Digit ROM 2/19: Towel Crunches and towel roll gripping  GOALS: Goals reviewed with patient? Yes  SHORT TERM GOALS: Target date: 08/16/23  Pt will be provided comprehensive HEP for RUE mobility in order to complete independent ADL's and IADL's.  Goal status: IN PROGRESS  2.  Pt will decrease pain in RUE to 2/10 in order to sleep for 3+ consecutive hours without waking due to pain.  Goal status: IN PROGRESS  3.  Pt will improve digit ROM in 4th and 5th RUE digits by 10 degrees or more in order to achieve a full fist needed for picking up and grasping items during child care.   Goal status: IN PROGRESS  4.  Pt will improve RUE grip strength by 30# and Pinch strength by 4# in order to grasp, lift, and carry items needed during cooking/cleaning/yard work tasks.  Goal status: IN PROGRESS  5.  Pt will improve RUE coordination by completing 9 hole peg test in 21" or less, in order to manipulate buttons, zippers, and laces for both his own dressing and assisting his child in dressing.   Goal status: IN PROGRESS   ASSESSMENT:  CLINICAL IMPRESSION: This session, pt presents with increased pain and mild swelling along the ulnar aspect of his R hand. He is able to complete all digit ROM, however with increased movements he demonstrated tremors due to effort. Overall gripping and picking up objects continues to be weak, especially with smaller objects that have some weight to them. OT providing verbal and tactile cuing for positioning and technique throughout session.   PERFORMANCE DEFICITS: in functional skills including ADLs, IADLs, coordination, sensation, ROM, strength, pain, fascial restrictions, Fine motor control, body mechanics, and UE functional  use.   PLAN:  OT FREQUENCY: 1x/week  OT DURATION: 4 weeks  PLANNED INTERVENTIONS: 97168 OT Re-evaluation, 97535 self care/ADL training, 84132 therapeutic exercise, 97530 therapeutic activity, 97112 neuromuscular re-education, 97140 manual therapy, 97018 paraffin, 44010 moist heat, 97032 electrical stimulation (manual), passive range of motion, functional mobility training, energy conservation, coping strategies training, patient/family education, and DME and/or AE instructions  RECOMMENDED OTHER SERVICES: Hand Specialist  CONSULTED AND AGREED WITH PLAN OF  CARE: Patient  PLAN FOR NEXT SESSION: Trial Manual therapy, Digit ROM, stretches   Trish Mage, OTR/L [facility] [phone] Kennyth Arnold, OT 07/17/2023, 10:54 AM

## 2023-07-22 DIAGNOSIS — S62317A Displaced fracture of base of fifth metacarpal bone. left hand, initial encounter for closed fracture: Secondary | ICD-10-CM | POA: Insufficient documentation

## 2023-07-24 ENCOUNTER — Ambulatory Visit: Payer: BC Managed Care – PPO | Admitting: Orthopaedic Surgery

## 2023-07-24 ENCOUNTER — Encounter: Payer: Self-pay | Admitting: Orthopaedic Surgery

## 2023-07-24 VITALS — BP 132/84 | HR 82 | Ht 71.0 in | Wt 126.0 lb

## 2023-07-24 DIAGNOSIS — S62346D Nondisplaced fracture of base of fifth metacarpal bone, right hand, subsequent encounter for fracture with routine healing: Secondary | ICD-10-CM

## 2023-07-24 MED ORDER — HYDROCODONE-ACETAMINOPHEN 5-325 MG PO TABS: ORAL_TABLET | ORAL | 0 refills | Status: AC

## 2023-07-24 NOTE — Progress Notes (Signed)
 My hand is still sore.  He has been going to OT for his hand.  He has pain of the right hand near the healed fracture of the fifth metacarpal head.  He is making progress.    His rotation of the little finger looks good today.  ROM is full.  He has weak grip.  NV intact. Encounter Diagnosis  Name Primary?   Closed nondisplaced fracture of base of fifth metacarpal bone of right hand with routine healing, subsequent encounter Yes   I have reviewed the [place] Controlled Substance Reporting System web site prior to prescribing narcotic medicine for this patient.  Return in six weeks.  Continue hand therapy.  Call if any problem.  Precautions discussed.  Electronically Signed Darreld Mclean, MD 2/26/20259:13 AM

## 2023-07-25 ENCOUNTER — Encounter (HOSPITAL_COMMUNITY): Payer: Self-pay | Admitting: Occupational Therapy

## 2023-07-25 ENCOUNTER — Ambulatory Visit (HOSPITAL_COMMUNITY): Payer: BC Managed Care – PPO | Admitting: Occupational Therapy

## 2023-07-25 DIAGNOSIS — M79644 Pain in right finger(s): Secondary | ICD-10-CM | POA: Diagnosis not present

## 2023-07-25 DIAGNOSIS — R29898 Other symptoms and signs involving the musculoskeletal system: Secondary | ICD-10-CM

## 2023-07-25 DIAGNOSIS — M25641 Stiffness of right hand, not elsewhere classified: Secondary | ICD-10-CM

## 2023-07-25 NOTE — Therapy (Unsigned)
 OUTPATIENT OCCUPATIONAL THERAPY ORTHO TREATMENT NOTE  Patient Name: Jonathan Hobbs MRN: [id number] DOB:[birth year], 33 y.o., male Today's Date: 07/26/2023  PCP: No PCP REFERRING PROVIDER: Darreld Mclean, MD  END OF SESSION:  OT End of Session - 07/25/23 1231     Visit Number 3    Number of Visits 5    Date for OT Re-Evaluation 08/16/23    Authorization Type [employer]    Authorization Time Period No Auth needed    OT Start Time 1153    OT Stop Time 1231    OT Time Calculation (min) 38 min    Activity Tolerance Patient tolerated treatment well    Behavior During Therapy WFL for tasks assessed/performed             Past Medical History:  Diagnosis Date   Asthma    History reviewed. No pertinent surgical history. There are no active problems to display for this patient.   ONSET DATE: ~December 1st, 2024  REFERRING DIAG: S62.346D (ICD-10-CM) - Closed nondisplaced fracture of base of fifth metacarpal bone of right hand with routine healing, subsequent encounter   THERAPY DIAG:  Pain in right finger(s)  Stiffness of finger joint of right hand  Other symptoms and signs involving the musculoskeletal system  Rationale for Evaluation and Treatment: Rehabilitation  SUBJECTIVE:   SUBJECTIVE STATEMENT: "I saw a different doctor and he said I'm not fully healed yet." Pt accompanied by: self  PERTINENT HISTORY: Pt was boxing and fractured his R 5th metacarpal bone. He maintained a splint for 4 weeks, with continued healing of the R hand.   PRECAUTIONS: None  WEIGHT BEARING RESTRICTIONS: No  PAIN:  Are you having pain? Yes: NPRS scale: 4/10 Pain location: MCP of 5th digit Pain description: tender Aggravating factors: movement/gripping Relieving factors: medication  FALLS: Has patient fallen in last 6 months? No  PLOF: Independent  PATIENT GOALS: "I want to be able to move my hand like I used to"  NEXT MD VISIT: 07/24/23  OBJECTIVE:  Note: Objective measures were  completed at Evaluation unless otherwise noted.  HAND DOMINANCE: Right  ADLs: Overall ADLs: Pt has mod to max difficulty with manipulating buttons and zippers, as well as fixing/maintaining his hair. Pt only able to hold his coffee cup with LUE or short 10-15 seconds with RUE.   FUNCTIONAL OUTCOME MEASURES: Quick Dash: 43.18  UPPER EXTREMITY ROM:     Active ROM Right eval  Thumb MCP (0-60)   Thumb IP (0-80)   Thumb Radial abd/add (0-55)   Thumb Palmar abd/add (0-45)   Thumb Opposition to Small Finger   Index MCP (0-90)   Index PIP (0-100)   Index DIP (0-70)    Long MCP (0-90)    Long PIP (0-100)    Long DIP (0-70)    Ring MCP (0-90)  75  Ring PIP (0-100)  90  Ring DIP (0-70)  45  Little MCP (0-90) 65   Little PIP (0-100)  80  Little DIP (0-70)  50  (Blank rows = not tested)  HAND FUNCTION: Grip strength: Right: 56 lbs; Left: 118 lbs, Lateral pinch: Right: 7 lbs, Left: 20 lbs, and 3 point pinch: Right: 7 lbs, Left: 19 lbs  COORDINATION: 9 Hole Peg test: Right: 28.77 sec; Left: 20.46 sec  SENSATION: WFL  EDEMA: Mild noted in ulnar side of palm  OBSERVATIONS: Fingers rotating to the R side in a fist, small lump/hump noted along the ulnar aspect of hand   TREATMENT DATE:  07/25/23 -Digit ROM: composite flexion, abduction/adduction, opposition, finger taps, x10 -yellow putty: roll into a ball, flatten into a pancake, PVC pipe to cut circles, roll into a log, tripod pinch, lateral pinch, roll into a ball, squeeze x10 -pinch tree: yellow, red, green resistance clip, D1/D4-5 pinch up and down vertical pole -gripper: 37# 8 medium beads, 42# 8 medium beads, 55# 8 medium beads   07/17/23 -Manual Therapy: myofascial release and trigger point along ulnar aspect of wrist, hand, and pinky finger, in order to reduce pain and fascial restrictions, as well as improve ROM.  -Digit ROM: composite flexion, abduction/adduction, opposition, finger taps, x10 -Towel crunches  x8 -Wringing out large towel roll x10, wringing out medium towel roll x10 -Wrist ROM: flexion, extension, ulnar/radial deviation, supination/pronation, x10  07/11/23: -Evaluation -Measurements -Digit ROM: composite flexion, abduction/adduction, opposition, finger taps, x10                                                                                                                               PATIENT EDUCATION: Education details: Leisure centre manager Person educated: Patient Education method: Explanation, Demonstration, and Handouts Education comprehension: verbalized understanding and returned demonstration  HOME EXERCISE PROGRAM: 2/13: Digit ROM 2/19: Towel Crunches and towel roll gripping 2/27: Hand Gripper  GOALS: Goals reviewed with patient? Yes  SHORT TERM GOALS: Target date: 08/16/23  Pt will be provided comprehensive HEP for RUE mobility in order to complete independent ADL's and IADL's.  Goal status: IN PROGRESS  2.  Pt will decrease pain in RUE to 2/10 in order to sleep for 3+ consecutive hours without waking due to pain.  Goal status: IN PROGRESS  3.  Pt will improve digit ROM in 4th and 5th RUE digits by 10 degrees or more in order to achieve a full fist needed for picking up and grasping items during child care.   Goal status: IN PROGRESS  4.  Pt will improve RUE grip strength by 30# and Pinch strength by 4# in order to grasp, lift, and carry items needed during cooking/cleaning/yard work tasks.  Goal status: IN PROGRESS  5.  Pt will improve RUE coordination by completing 9 hole peg test in 21" or less, in order to manipulate buttons, zippers, and laces for both his own dressing and assisting his child in dressing.   Goal status: IN PROGRESS   ASSESSMENT:  CLINICAL IMPRESSION: This session, pt continuing to demonstrate improving strength and pain tolerance. He saw MD who said to continue with therapy and that his finger positioning looks good. This session  pt and OT worked on grip and pinch strengthening, along with continued improving movements. With increased work on strengthening, pt did demonstrate moderate fatigue, requiring intermittent rest breaks. Verbal and tactile cuing provided for positioning and technique throughout session.   PERFORMANCE DEFICITS: in functional skills including ADLs, IADLs, coordination, sensation, ROM, strength, pain, fascial restrictions, Fine motor control, body mechanics, and UE functional use.   PLAN:  OT FREQUENCY: 1x/week  OT DURATION: 4 weeks  PLANNED INTERVENTIONS: 97168 OT Re-evaluation, 97535 self care/ADL training, 16109 therapeutic exercise, 97530 therapeutic activity, 97112 neuromuscular re-education, 97140 manual therapy, 97018 paraffin, 60454 moist heat, 97032 electrical stimulation (manual), passive range of motion, functional mobility training, energy conservation, coping strategies training, patient/family education, and DME and/or AE instructions  RECOMMENDED OTHER SERVICES: Hand Specialist  CONSULTED AND AGREED WITH PLAN OF CARE: Patient  PLAN FOR NEXT SESSION: Trial Manual therapy, Digit ROM, stretches, strengthening   Trish Mage, OTR/L [facility] [phone] Kennyth Arnold, OT 07/26/2023, 11:05 AM

## 2023-08-01 ENCOUNTER — Ambulatory Visit (HOSPITAL_COMMUNITY): Payer: BC Managed Care – PPO | Attending: Orthopaedic Surgery | Admitting: Occupational Therapy

## 2023-08-01 ENCOUNTER — Encounter (HOSPITAL_COMMUNITY): Payer: Self-pay | Admitting: Occupational Therapy

## 2023-08-01 DIAGNOSIS — R29898 Other symptoms and signs involving the musculoskeletal system: Secondary | ICD-10-CM | POA: Diagnosis present

## 2023-08-01 DIAGNOSIS — M79644 Pain in right finger(s): Secondary | ICD-10-CM | POA: Diagnosis present

## 2023-08-01 DIAGNOSIS — M25641 Stiffness of right hand, not elsewhere classified: Secondary | ICD-10-CM | POA: Insufficient documentation

## 2023-08-01 NOTE — Therapy (Signed)
 OUTPATIENT OCCUPATIONAL THERAPY ORTHO TREATMENT NOTE  Patient Name: Jonathan Hobbs MRN: [id number] DOB:May 28, 1991, 33 y.o., male Today's Date: 08/01/2023  PCP: No PCP REFERRING PROVIDER: Darreld Mclean, MD  END OF SESSION:  OT End of Session - 08/01/23 1229     Visit Number 4    Number of Visits 5    Date for OT Re-Evaluation 08/16/23    Authorization Type BCBS    Authorization Time Period No Auth needed    OT Start Time 1146    OT Stop Time 1229    OT Time Calculation (min) 43 min    Activity Tolerance Patient tolerated treatment well    Behavior During Therapy WFL for tasks assessed/performed             Past Medical History:  Diagnosis Date   Asthma    History reviewed. No pertinent surgical history. There are no active problems to display for this patient.   ONSET DATE: ~December 1st, 2024  REFERRING DIAG: S62.346D (ICD-10-CM) - Closed nondisplaced fracture of base of fifth metacarpal bone of right hand with routine healing, subsequent encounter   THERAPY DIAG:  Pain in right finger(s)  Stiffness of finger joint of right hand  Other symptoms and signs involving the musculoskeletal system  Rationale for Evaluation and Treatment: Rehabilitation  SUBJECTIVE:   SUBJECTIVE STATEMENT: "I saw a different doctor and he said I'm not fully healed yet." Pt accompanied by: self  PERTINENT HISTORY: Pt was boxing and fractured his R 5th metacarpal bone. He maintained a splint for 4 weeks, with continued healing of the R hand.   PRECAUTIONS: None  WEIGHT BEARING RESTRICTIONS: No  PAIN:  Are you having pain? Yes: NPRS scale: 4/10 Pain location: MCP of 5th digit Pain description: tender Aggravating factors: movement/gripping Relieving factors: medication  FALLS: Has patient fallen in last 6 months? No  PLOF: Independent  PATIENT GOALS: "I want to be able to move my hand like I used to"  NEXT MD VISIT: 07/24/23  OBJECTIVE:  Note: Objective measures were  completed at Evaluation unless otherwise noted.  HAND DOMINANCE: Right  ADLs: Overall ADLs: Pt has mod to max difficulty with manipulating buttons and zippers, as well as fixing/maintaining his hair. Pt only able to hold his coffee cup with LUE or short 10-15 seconds with RUE.   FUNCTIONAL OUTCOME MEASURES: Quick Dash: 43.18  UPPER EXTREMITY ROM:     Active ROM Right eval  Thumb MCP (0-60)   Thumb IP (0-80)   Thumb Radial abd/add (0-55)   Thumb Palmar abd/add (0-45)   Thumb Opposition to Small Finger   Index MCP (0-90)   Index PIP (0-100)   Index DIP (0-70)    Long MCP (0-90)    Long PIP (0-100)    Long DIP (0-70)    Ring MCP (0-90)  75  Ring PIP (0-100)  90  Ring DIP (0-70)  45  Little MCP (0-90) 65   Little PIP (0-100)  80  Little DIP (0-70)  50  (Blank rows = not tested)  HAND FUNCTION: Grip strength: Right: 56 lbs; Left: 118 lbs, Lateral pinch: Right: 7 lbs, Left: 20 lbs, and 3 point pinch: Right: 7 lbs, Left: 19 lbs  COORDINATION: 9 Hole Peg test: Right: 28.77 sec; Left: 20.46 sec  SENSATION: WFL  EDEMA: Mild noted in ulnar side of palm  OBSERVATIONS: Fingers rotating to the R side in a fist, small lump/hump noted along the ulnar aspect of hand   TREATMENT DATE:  08/01/23 -Paraffin Bath: 10' with moist heat -Manual Therapy: myofascial release and trigger point along ulnar aspect of wrist, hand, and pinky finger, in order to reduce pain and fascial restrictions, as well as improve ROM.  -Wrist Strengthening: 4#, flexion, extension, ulnar/radial deviation, supination/pronation, x12 -Wrist ABC's with green weighted ball -Gripper: 55# vertical grip 8 medium beads, 42# vertical grip 8 small beads  07/25/23 -Digit ROM: composite flexion, abduction/adduction, opposition, finger taps, x10 -yellow putty: roll into a ball, flatten into a pancake, PVC pipe to cut circles, roll into a log, tripod pinch, lateral pinch, roll into a ball, squeeze x10 -pinch tree:  yellow, red, green resistance clip, D1/D4-5 pinch up and down vertical pole -gripper: 37# 8 medium beads, 42# 8 medium beads, 55# 8 medium beads   07/17/23 -Manual Therapy: myofascial release and trigger point along ulnar aspect of wrist, hand, and pinky finger, in order to reduce pain and fascial restrictions, as well as improve ROM.  -Digit ROM: composite flexion, abduction/adduction, opposition, finger taps, x10 -Towel crunches x8 -Wringing out large towel roll x10, wringing out medium towel roll x10 -Wrist ROM: flexion, extension, ulnar/radial deviation, supination/pronation, x10    PATIENT EDUCATION: Education details: Wrist strengthening Person educated: Patient Education method: Explanation, Demonstration, and Handouts Education comprehension: verbalized understanding and returned demonstration  HOME EXERCISE PROGRAM: 2/13: Digit ROM 2/19: Towel Crunches and towel roll gripping 2/27: Hand Gripper 3/6: Wrist Strengthening  GOALS: Goals reviewed with patient? Yes  SHORT TERM GOALS: Target date: 08/16/23  Pt will be provided comprehensive HEP for RUE mobility in order to complete independent ADL's and IADL's.  Goal status: IN PROGRESS  2.  Pt will decrease pain in RUE to 2/10 in order to sleep for 3+ consecutive hours without waking due to pain.  Goal status: IN PROGRESS  3.  Pt will improve digit ROM in 4th and 5th RUE digits by 10 degrees or more in order to achieve a full fist needed for picking up and grasping items during child care.   Goal status: IN PROGRESS  4.  Pt will improve RUE grip strength by 30# and Pinch strength by 4# in order to grasp, lift, and carry items needed during cooking/cleaning/yard work tasks.  Goal status: IN PROGRESS  5.  Pt will improve RUE coordination by completing 9 hole peg test in 21" or less, in order to manipulate buttons, zippers, and laces for both his own dressing and assisting his child in dressing.   Goal status: IN  PROGRESS   ASSESSMENT:  CLINICAL IMPRESSION: This session, pt reporting increased pain and stiffness, therefore OT had pt complete paraffin bath and manual therapy to improve mobility and decrease pain. Remainder of session worked on strengthening at both the wrist and grip, where he had mod difficulty due to weakness along the ulnar side and fatigued grip strength. Verbal and tactile cuing provided for positioning and technique throughout session.   PERFORMANCE DEFICITS: in functional skills including ADLs, IADLs, coordination, sensation, ROM, strength, pain, fascial restrictions, Fine motor control, body mechanics, and UE functional use.   PLAN:  OT FREQUENCY: 1x/week  OT DURATION: 4 weeks  PLANNED INTERVENTIONS: 97168 OT Re-evaluation, 97535 self care/ADL training, 16109 therapeutic exercise, 97530 therapeutic activity, 97112 neuromuscular re-education, 97140 manual therapy, 97018 paraffin, 60454 moist heat, 97032 electrical stimulation (manual), passive range of motion, functional mobility training, energy conservation, coping strategies training, patient/family education, and DME and/or AE instructions  RECOMMENDED OTHER SERVICES: Hand Specialist  CONSULTED AND AGREED WITH PLAN OF CARE:  Patient  PLAN FOR NEXT SESSION: Trial Manual therapy, Digit ROM, stretches, strengthening   Trish Mage, OTR/L [facility] [phone] Kennyth Arnold, OT 08/01/2023, 12:30 PM

## 2023-08-08 ENCOUNTER — Ambulatory Visit (HOSPITAL_COMMUNITY): Payer: BC Managed Care – PPO | Admitting: Occupational Therapy

## 2023-08-08 ENCOUNTER — Encounter (HOSPITAL_COMMUNITY): Payer: Self-pay | Admitting: Occupational Therapy

## 2023-08-08 DIAGNOSIS — M79644 Pain in right finger(s): Secondary | ICD-10-CM

## 2023-08-08 DIAGNOSIS — M25641 Stiffness of right hand, not elsewhere classified: Secondary | ICD-10-CM

## 2023-08-08 DIAGNOSIS — R29898 Other symptoms and signs involving the musculoskeletal system: Secondary | ICD-10-CM

## 2023-08-08 NOTE — Therapy (Signed)
 OUTPATIENT OCCUPATIONAL THERAPY ORTHO TREATMENT NOTE REASSESSMENT/RECERTIFICATION  Patient Name: Jonathan Hobbs MRN: [id number] DOB:1991/02/11, 33 y.o., male Today's Date: 08/08/2023  PCP: No PCP REFERRING PROVIDER: Darreld Mclean, MD  END OF SESSION:  OT End of Session - 08/08/23 1147     Visit Number 5    Number of Visits 8    Date for OT Re-Evaluation 08/30/23    Authorization Type BCBS    Authorization Time Period No Auth needed    OT Start Time 1107    OT Stop Time 1147    OT Time Calculation (min) 40 min    Activity Tolerance Patient tolerated treatment well    Behavior During Therapy WFL for tasks assessed/performed             Past Medical History:  Diagnosis Date   Asthma    History reviewed. No pertinent surgical history. There are no active problems to display for this patient.   ONSET DATE: ~December 1st, 2024  REFERRING DIAG: S62.346D (ICD-10-CM) - Closed nondisplaced fracture of base of fifth metacarpal bone of right hand with routine healing, subsequent encounter   THERAPY DIAG:  Pain in right finger(s)  Stiffness of finger joint of right hand  Other symptoms and signs involving the musculoskeletal system  Rationale for Evaluation and Treatment: Rehabilitation  SUBJECTIVE:   SUBJECTIVE STATEMENT: "It hurts when I put my hair up, it hurts when I push up from the chair" Pt accompanied by: self  PERTINENT HISTORY: Pt was boxing and fractured his R 5th metacarpal bone. He maintained a splint for 4 weeks, with continued healing of the R hand.   PRECAUTIONS: None  WEIGHT BEARING RESTRICTIONS: No  PAIN:  Are you having pain? Yes: NPRS scale: 5/10 Pain location: MCP of 5th digit Pain description: tender Aggravating factors: movement/gripping Relieving factors: medication  FALLS: Has patient fallen in last 6 months? No  PLOF: Independent  PATIENT GOALS: "I want to be able to move my hand like I used to"  NEXT MD VISIT:  07/24/23  OBJECTIVE:  Note: Objective measures were completed at Evaluation unless otherwise noted.  HAND DOMINANCE: Right  ADLs: Overall ADLs: Pt has mod to max difficulty with manipulating buttons and zippers, as well as fixing/maintaining his hair. Pt only able to hold his coffee cup with LUE or short 10-15 seconds with RUE.   FUNCTIONAL OUTCOME MEASURES: Quick Dash: 43.18 08/08/23: 20.45  UPPER EXTREMITY ROM:     Active ROM Right eval Right 08/08/23  Thumb MCP (0-60)    Thumb IP (0-80)    Thumb Radial abd/add (0-55)    Thumb Palmar abd/add (0-45)    Thumb Opposition to Small Finger    Index MCP (0-90)    Index PIP (0-100)    Index DIP (0-70)     Long MCP (0-90)     Long PIP (0-100)     Long DIP (0-70)     Ring MCP (0-90)  75 90  Ring PIP (0-100)  90 90  Ring DIP (0-70)  45 65  Little MCP (0-90) 65  85  Little PIP (0-100)  80 95  Little DIP (0-70)  50 55  (Blank rows = not tested)  HAND FUNCTION: Grip strength: Right: 56 lbs; Left: 118 lbs, Lateral pinch: Right: 7 lbs, Left: 20 lbs, and 3 point pinch: Right: 7 lbs, Left: 19 lbs 08/08/23: Grip strength: Right: 81 lbs; Lateral pinch: Right: 14 lbs, and 3 point pinch: Right: 13 lbs  COORDINATION: [address]  Peg test: Right: 28.77 sec; Left: 20.46 sec 08/08/23: 9 Hole Peg test: Right: 24.77 sec  SENSATION: WFL  EDEMA: Mild noted in ulnar side of palm  OBSERVATIONS: Fingers rotating to the R side in a fist, small lump/hump noted along the ulnar aspect of hand   TREATMENT DATE:   08/08/23 -Manual Therapy: myofascial release and trigger point along ulnar aspect of wrist, hand, and pinky finger, in order to reduce pain and fascial restrictions, as well as improve ROM.  -Wrist Strengthening: 4#, flexion, extension, ulnar/radial deviation, supination/pronation, x12 -Digit ROM: composite flexion, abduction/adduction, opposition, finger taps, x10 - 9 hole peg test -Measurements for reassessment  08/01/23 -Paraffin Bath: 10'  with moist heat -Manual Therapy: myofascial release and trigger point along ulnar aspect of wrist, hand, and pinky finger, in order to reduce pain and fascial restrictions, as well as improve ROM.  -Wrist Strengthening: 4#, flexion, extension, ulnar/radial deviation, supination/pronation, x12 -Wrist ABC's with green weighted ball -Gripper: 55# vertical grip 8 medium beads, 42# vertical grip 8 small beads  07/25/23 -Digit ROM: composite flexion, abduction/adduction, opposition, finger taps, x10 -yellow putty: roll into a ball, flatten into a pancake, PVC pipe to cut circles, roll into a log, tripod pinch, lateral pinch, roll into a ball, squeeze x10 -pinch tree: yellow, red, green resistance clip, D1/D4-5 pinch up and down vertical pole -gripper: 37# 8 medium beads, 42# 8 medium beads, 55# 8 medium beads   07/17/23 -Manual Therapy: myofascial release and trigger point along ulnar aspect of wrist, hand, and pinky finger, in order to reduce pain and fascial restrictions, as well as improve ROM.  -Digit ROM: composite flexion, abduction/adduction, opposition, finger taps, x10 -Towel crunches x8 -Wringing out large towel roll x10, wringing out medium towel roll x10 -Wrist ROM: flexion, extension, ulnar/radial deviation, supination/pronation, x10    PATIENT EDUCATION: Education details: Wrist strengthening Person educated: Patient Education method: Explanation, Demonstration, and Handouts Education comprehension: verbalized understanding and returned demonstration  HOME EXERCISE PROGRAM: 2/13: Digit ROM 2/19: Towel Crunches and towel roll gripping 2/27: Hand Gripper 3/6: Wrist Strengthening  GOALS: Goals reviewed with patient? Yes  SHORT TERM GOALS: Target date: 08/16/23  Pt will be provided comprehensive HEP for RUE mobility in order to complete independent ADL's and IADL's.  Goal status: IN PROGRESS  2.  Pt will decrease pain in RUE to 2/10 in order to sleep for 3+ consecutive  hours without waking due to pain.  Goal status: IN PROGRESS  3.  Pt will improve digit ROM in 4th and 5th RUE digits by 10 degrees or more in order to achieve a full fist needed for picking up and grasping items during child care.   Goal status: MET  4.  Pt will improve RUE grip strength by 30# and Pinch strength by 4# in order to grasp, lift, and carry items needed during cooking/cleaning/yard work tasks.  Goal status: IN PROGRESS  5.  Pt will improve RUE coordination by completing 9 hole peg test in 21" or less, in order to manipulate buttons, zippers, and laces for both his own dressing and assisting his child in dressing.   Goal status: IN PROGRESS   ASSESSMENT:  CLINICAL IMPRESSION: This session, pt completed reassessment for recertification this session. He is demonstrating good improvements with ROM, strength, and coordination, however continues to be fairly significantly limited by pain. OT provided manual therapy to improve fascial restrictions and limit pain, prior to assisting pt with stretching out and completing ROM. Verbal and tactile cuing provided  for positioning and technique throughout session.   PERFORMANCE DEFICITS: in functional skills including ADLs, IADLs, coordination, sensation, ROM, strength, pain, fascial restrictions, Fine motor control, body mechanics, and UE functional use.   PLAN:  OT FREQUENCY: 1x/week  OT DURATION: 4 weeks  PLANNED INTERVENTIONS: 97168 OT Re-evaluation, 97535 self care/ADL training, 16109 therapeutic exercise, 97530 therapeutic activity, 97112 neuromuscular re-education, 97140 manual therapy, 97018 paraffin, 60454 moist heat, 97032 electrical stimulation (manual), passive range of motion, functional mobility training, energy conservation, coping strategies training, patient/family education, and DME and/or AE instructions  RECOMMENDED OTHER SERVICES: Hand Specialist  CONSULTED AND AGREED WITH PLAN OF CARE: Patient  PLAN FOR NEXT  SESSION: Trial Manual therapy, Digit ROM, stretches, strengthening   Trish Mage, OTR/L [facility] [phone] Angely Dietz Rosemarie Beath, OT 08/08/2023, 1:30 PM

## 2023-08-14 ENCOUNTER — Ambulatory Visit: Payer: BC Managed Care – PPO | Admitting: Orthopaedic Surgery

## 2023-08-15 ENCOUNTER — Telehealth: Payer: Self-pay | Admitting: Orthopaedic Surgery

## 2023-08-15 NOTE — Telephone Encounter (Signed)
 Please advise if patient is still out of work. I have received forms. Patient last seen 07/03/23, work status not addressed, next rov 4/9. Please advise. Thank you!

## 2023-08-16 ENCOUNTER — Ambulatory Visit (HOSPITAL_COMMUNITY): Admitting: Occupational Therapy

## 2023-08-16 ENCOUNTER — Encounter (HOSPITAL_COMMUNITY): Payer: Self-pay | Admitting: Occupational Therapy

## 2023-08-16 DIAGNOSIS — M25641 Stiffness of right hand, not elsewhere classified: Secondary | ICD-10-CM

## 2023-08-16 DIAGNOSIS — R29898 Other symptoms and signs involving the musculoskeletal system: Secondary | ICD-10-CM

## 2023-08-16 DIAGNOSIS — M79644 Pain in right finger(s): Secondary | ICD-10-CM | POA: Diagnosis not present

## 2023-08-16 NOTE — Telephone Encounter (Signed)
 Dr Hilda Lias, is patient still out of work?

## 2023-08-16 NOTE — Therapy (Signed)
 OUTPATIENT OCCUPATIONAL THERAPY ORTHO TREATMENT NOTE  Patient Name: Jonathan Hobbs MRN: [id number] DOB:[birth year], 33 y.o., male Today's Date: 08/16/2023  PCP: No PCP REFERRING PROVIDER: Darreld Mclean, MD  END OF SESSION:  OT End of Session - 08/16/23 0936     Visit Number 6    Number of Visits 8    Date for OT Re-Evaluation 08/30/23    Authorization Type BCBS    Authorization Time Period No Auth needed    OT Start Time 0854    OT Stop Time 0936    OT Time Calculation (min) 42 min    Activity Tolerance Patient tolerated treatment well    Behavior During Therapy [facility] for tasks assessed/performed              Past Medical History:  Diagnosis Date   Asthma    History reviewed. No pertinent surgical history. There are no active problems to display for this patient.   ONSET DATE: ~December 1st, 2024  REFERRING DIAG: S62.346D (ICD-10-CM) - Closed nondisplaced fracture of base of fifth metacarpal bone of right hand with routine healing, subsequent encounter   THERAPY DIAG:  Pain in right finger(s)  Stiffness of finger joint of right hand  Other symptoms and signs involving the musculoskeletal system  Rationale for Evaluation and Treatment: Rehabilitation  SUBJECTIVE:   SUBJECTIVE STATEMENT: "My pinky still hurts on the bone" Pt accompanied by: self  PERTINENT HISTORY: Pt was boxing and fractured his R 5th metacarpal bone. He maintained a splint for 4 weeks, with continued healing of the R hand.   PRECAUTIONS: None  WEIGHT BEARING RESTRICTIONS: No  PAIN:  Are you having pain? Yes: NPRS scale: 5/10 Pain location: head of MCP of 5th digit Pain description: tender Aggravating factors: movement/gripping Relieving factors: medication  FALLS: Has patient fallen in last 6 months? No  PLOF: Independent  PATIENT GOALS: "I want to be able to move my hand like I used to"  NEXT MD VISIT: 07/24/23  OBJECTIVE:  Note: Objective measures were completed at  Evaluation unless otherwise noted.  HAND DOMINANCE: Right  ADLs: Overall ADLs: Pt has mod to max difficulty with manipulating buttons and zippers, as well as fixing/maintaining his hair. Pt only able to hold his coffee cup with LUE or short 10-15 seconds with RUE.   FUNCTIONAL OUTCOME MEASURES: Quick Dash: 43.18 08/08/23: 20.45  UPPER EXTREMITY ROM:     Active ROM Right eval Right 08/08/23  Thumb MCP (0-60)    Thumb IP (0-80)    Thumb Radial abd/add (0-55)    Thumb Palmar abd/add (0-45)    Thumb Opposition to Small Finger    Index MCP (0-90)    Index PIP (0-100)    Index DIP (0-70)     Long MCP (0-90)     Long PIP (0-100)     Long DIP (0-70)     Ring MCP (0-90)  75 90  Ring PIP (0-100)  90 90  Ring DIP (0-70)  45 65  Little MCP (0-90) 65  85  Little PIP (0-100)  80 95  Little DIP (0-70)  50 55  (Blank rows = not tested)  HAND FUNCTION: Grip strength: Right: 56 lbs; Left: 118 lbs, Lateral pinch: Right: 7 lbs, Left: 20 lbs, and 3 point pinch: Right: 7 lbs, Left: 19 lbs 08/08/23: Grip strength: Right: 81 lbs; Lateral pinch: Right: 14 lbs, and 3 point pinch: Right: 13 lbs  COORDINATION: 9 Hole Peg test: Right: 28.77 sec; Left: 20.46 sec  08/08/23: 9 Hole Peg test: Right: 24.77 sec  SENSATION: WFL  EDEMA: Mild noted in ulnar side of palm  OBSERVATIONS: Fingers rotating to the R side in a fist, small lump/hump noted along the ulnar aspect of hand   TREATMENT DATE:   08/16/23 -Digit ROM: composite flexion, abduction/adduction, opposition, finger taps, x10 -Wrist ROM: flexion, extension, ulnar/radial deviation, supination/pronation, x15 -Wrist ABC's with green weighted ball, x2 rounds -grooved peg board: picking up pegs with D1, D4, and D5 and placing them in the board -Digiflex: 7#, full squeeze x10, each digit pinch x10 -Gripper: 55#, 8 tiny beads, 5 large beads, 8 medium beads  08/08/23 -Manual Therapy: myofascial release and trigger point along ulnar aspect of  wrist, hand, and pinky finger, in order to reduce pain and fascial restrictions, as well as improve ROM.  -Wrist Strengthening: 4#, flexion, extension, ulnar/radial deviation, supination/pronation, x12 -Digit ROM: composite flexion, abduction/adduction, opposition, finger taps, x10 - 9 hole peg test -Measurements for reassessment  08/01/23 -Paraffin Bath: 10' with moist heat -Manual Therapy: myofascial release and trigger point along ulnar aspect of wrist, hand, and pinky finger, in order to reduce pain and fascial restrictions, as well as improve ROM.  -Wrist Strengthening: 4#, flexion, extension, ulnar/radial deviation, supination/pronation, x12 -Wrist ABC's with green weighted ball -Gripper: 55# vertical grip 8 medium beads, 42# vertical grip 8 small beads   PATIENT EDUCATION: Education details: Continue HEP Person educated: Patient Education method: Programmer, multimedia, Demonstration, and Handouts Education comprehension: verbalized understanding and returned demonstration  HOME EXERCISE PROGRAM: 2/13: Digit ROM 2/19: Towel Crunches and towel roll gripping 2/27: Hand Gripper 3/6: Wrist Strengthening  GOALS: Goals reviewed with patient? Yes  SHORT TERM GOALS: Target date: 08/16/23  Pt will be provided comprehensive HEP for RUE mobility in order to complete independent ADL's and IADL's.  Goal status: IN PROGRESS  2.  Pt will decrease pain in RUE to 2/10 in order to sleep for 3+ consecutive hours without waking due to pain.  Goal status: IN PROGRESS  3.  Pt will improve digit ROM in 4th and 5th RUE digits by 10 degrees or more in order to achieve a full fist needed for picking up and grasping items during child care.   Goal status: MET  4.  Pt will improve RUE grip strength by 30# and Pinch strength by 4# in order to grasp, lift, and carry items needed during cooking/cleaning/yard work tasks.  Goal status: IN PROGRESS  5.  Pt will improve RUE coordination by completing 9 hole peg  test in 21" or less, in order to manipulate buttons, zippers, and laces for both his own dressing and assisting his child in dressing.   Goal status: IN PROGRESS   ASSESSMENT:  CLINICAL IMPRESSION: This session, pt continues to report pain in the head of his MCP bone of the 5th digit, specifically with pressure. He continues to work on wrist mobility which is [facility]. Pt also worked on using the pinky during pinching/fine motor tasks, as well as continuing to address his overall gross grip. Verbal and tactile cuing provided for positioning and technique throughout session.   PERFORMANCE DEFICITS: in functional skills including ADLs, IADLs, coordination, sensation, ROM, strength, pain, fascial restrictions, Fine motor control, body mechanics, and UE functional use.   PLAN:  OT FREQUENCY: 1x/week  OT DURATION: 4 weeks  PLANNED INTERVENTIONS: 97168 OT Re-evaluation, 97535 self care/ADL training, 40981 therapeutic exercise, 97530 therapeutic activity, 97112 neuromuscular re-education, 97140 manual therapy, 97018 paraffin, 19147 moist heat, 97032 electrical stimulation (  manual), passive range of motion, functional mobility training, energy conservation, coping strategies training, patient/family education, and DME and/or AE instructions  RECOMMENDED OTHER SERVICES: Hand Specialist  CONSULTED AND AGREED WITH PLAN OF CARE: Patient  PLAN FOR NEXT SESSION: PRN Manual therapy, Digit ROM, stretches, strengthening   Trish Mage, OTR/L [facility] [phone] Kennyth Arnold, OT 08/16/2023, 9:37 AM

## 2023-08-19 NOTE — Telephone Encounter (Signed)
 Forms completed with [employer] 08/19/23, faxed.

## 2023-08-22 ENCOUNTER — Encounter (HOSPITAL_COMMUNITY): Payer: Self-pay | Admitting: Occupational Therapy

## 2023-08-22 ENCOUNTER — Ambulatory Visit (HOSPITAL_COMMUNITY): Admitting: Occupational Therapy

## 2023-08-22 DIAGNOSIS — R29898 Other symptoms and signs involving the musculoskeletal system: Secondary | ICD-10-CM

## 2023-08-22 DIAGNOSIS — M25641 Stiffness of right hand, not elsewhere classified: Secondary | ICD-10-CM

## 2023-08-22 DIAGNOSIS — M79644 Pain in right finger(s): Secondary | ICD-10-CM | POA: Diagnosis not present

## 2023-08-22 NOTE — Therapy (Unsigned)
 OUTPATIENT OCCUPATIONAL THERAPY ORTHO TREATMENT NOTE DISCHARGE NOTE  Patient Name: Jonathan Hobbs MRN: [id number] DOB:06-16-1990, 33 y.o., male Today's Date: 08/23/2023  PCP: No PCP REFERRING PROVIDER: Darreld Mclean, MD  OCCUPATIONAL THERAPY DISCHARGE SUMMARY  Visits from Start of Care: 7  Current functional level related to goals / functional outcomes: Pt has met 5 out of 5 OT goals. Grip and pinch WFL, good coordination, pain decreased, and overall mobility is functional.    Remaining deficits: Pt has no remaining deficits.    Education / Equipment: Pt provided comprehensive HEP.    Plan: Patient agrees to discharge as OT goals have been met       END OF SESSION:  OT End of Session - 08/22/23 1104     Visit Number 7    Number of Visits 8    Date for OT Re-Evaluation 08/30/23    Authorization Type [employer]    Authorization Time Period No Auth needed    OT Start Time 1022    OT Stop Time 1104    OT Time Calculation (min) 42 min    Activity Tolerance Patient tolerated treatment well    Behavior During Therapy WFL for tasks assessed/performed               Past Medical History:  Diagnosis Date   Asthma    History reviewed. No pertinent surgical history. There are no active problems to display for this patient.   ONSET DATE: ~December 1st, 2024  REFERRING DIAG: S62.346D (ICD-10-CM) - Closed nondisplaced fracture of base of fifth metacarpal bone of right hand with routine healing, subsequent encounter   THERAPY DIAG:  Pain in right finger(s)  Stiffness of finger joint of right hand  Other symptoms and signs involving the musculoskeletal system  Rationale for Evaluation and Treatment: Rehabilitation  SUBJECTIVE:   SUBJECTIVE STATEMENT: "My hand is stiff" Pt accompanied by: self  PERTINENT HISTORY: Pt was boxing and fractured his R 5th metacarpal bone. He maintained a splint for 4 weeks, with continued healing of the R hand.   PRECAUTIONS:  None  WEIGHT BEARING RESTRICTIONS: No  PAIN:  Are you having pain? Yes: NPRS scale: 4/10 Pain location: head of MCP of 5th digit Pain description: tender Aggravating factors: movement/gripping Relieving factors: medication  FALLS: Has patient fallen in last 6 months? No  PLOF: Independent  PATIENT GOALS: "I want to be able to move my hand like I used to"  NEXT MD VISIT: 09/11/23  OBJECTIVE:  Note: Objective measures were completed at Evaluation unless otherwise noted.  HAND DOMINANCE: Right  ADLs: Overall ADLs: Pt has mod to max difficulty with manipulating buttons and zippers, as well as fixing/maintaining his hair. Pt only able to hold his coffee cup with LUE or short 10-15 seconds with RUE.   FUNCTIONAL OUTCOME MEASURES: Quick Dash: 43.18 08/08/23: 20.45  UPPER EXTREMITY ROM:     Active ROM Right eval Right 08/08/23 Right 08/22/23  Thumb MCP (0-60)     Thumb IP (0-80)     Thumb Radial abd/add (0-55)     Thumb Palmar abd/add (0-45)     Thumb Opposition to Small Finger     Index MCP (0-90)     Index PIP (0-100)     Index DIP (0-70)      Long MCP (0-90)      Long PIP (0-100)      Long DIP (0-70)      Ring MCP (0-90)  75 90 90  Ring PIP (  0-100)  90 90 95  Ring DIP (0-70)  45 65 65  Little MCP (0-90) 65  85 85  Little PIP (0-100)  80 95 95  Little DIP (0-70)  50 55 60  (Blank rows = not tested)  HAND FUNCTION: Grip strength: Right: 56 lbs; Left: 118 lbs, Lateral pinch: Right: 7 lbs, Left: 20 lbs, and 3 point pinch: Right: 7 lbs, Left: 19 lbs 08/08/23: Grip strength: Right: 81 lbs; Lateral pinch: Right: 14 lbs, and 3 point pinch: Right: 13 lbs 08/22/23: Grip strength: Right: 126 lbs; Lateral pinch: Right: 20 lbs, and 3 point pinch: Right: 16 lbs  COORDINATION: 9 Hole Peg test: Right: 28.77 sec; Left: 20.46 sec 08/08/23: 9 Hole Peg test: Right: 24.77 sec 08/22/23: 9 Hole Peg test: Right: 18.39 sec  SENSATION: WFL  EDEMA: Mild noted in ulnar side of  palm  OBSERVATIONS: Fingers rotating to the R side in a fist, small lump/hump noted along the ulnar aspect of hand   TREATMENT DATE:   08/22/23 -Manual Therapy: myofascial release and trigger point along ulnar aspect of wrist, hand, and pinky finger, in order to reduce pain and fascial restrictions, as well as improve ROM.  -Digit ROM: composite flexion, abduction/adduction, opposition, finger taps, x10 -9 hole peg test -Theraputty: red putty, roll into a ball, flatten into pancake, roll into a log, tip to tip with D1 to D4/D5 -Wrist ROM: flexion, extension, ulnar/radial deviation, supination/pronation, x15 -measurements  08/16/23 -Digit ROM: composite flexion, abduction/adduction, opposition, finger taps, x10 -Wrist ROM: flexion, extension, ulnar/radial deviation, supination/pronation, x15 -Wrist ABC's with green weighted ball, x2 rounds -grooved peg board: picking up pegs with D1, D4, and D5 and placing them in the board -Digiflex: 7#, full squeeze x10, each digit pinch x10 -Gripper: 55#, 8 tiny beads, 5 large beads, 8 medium beads  08/08/23 -Manual Therapy: myofascial release and trigger point along ulnar aspect of wrist, hand, and pinky finger, in order to reduce pain and fascial restrictions, as well as improve ROM.  -Wrist Strengthening: 4#, flexion, extension, ulnar/radial deviation, supination/pronation, x12 -Digit ROM: composite flexion, abduction/adduction, opposition, finger taps, x10 - 9 hole peg test -Measurements for reassessment   PATIENT EDUCATION: Education details: Continue HEP Person educated: Patient Education method: Explanation, Demonstration, and Handouts Education comprehension: verbalized understanding and returned demonstration  HOME EXERCISE PROGRAM: 2/13: Digit ROM 2/19: Towel Crunches and towel roll gripping 2/27: Hand Gripper 3/6: Wrist Strengthening  GOALS: Goals reviewed with patient? Yes  SHORT TERM GOALS: Target date: 08/16/23  Pt will be  provided comprehensive HEP for RUE mobility in order to complete independent ADL's and IADL's.  Goal status: MET  2.  Pt will decrease pain in RUE to 2/10 in order to sleep for 3+ consecutive hours without waking due to pain.  Goal status: MET  3.  Pt will improve digit ROM in 4th and 5th RUE digits by 10 degrees or more in order to achieve a full fist needed for picking up and grasping items during child care.   Goal status: MET  4.  Pt will improve RUE grip strength by 30# and Pinch strength by 4# in order to grasp, lift, and carry items needed during cooking/cleaning/yard work tasks.  Goal status: MET  5.  Pt will improve RUE coordination by completing 9 hole peg test in 21" or less, in order to manipulate buttons, zippers, and laces for both his own dressing and assisting his child in dressing.   Goal status: MET   ASSESSMENT:  CLINICAL IMPRESSION: This session pt completing reassessment for discharge. His ROM, strength, grip and pinch are all WFL. Pt's pain overall is minimal with mild discomfort with extensive gripping. He has no further skilled OT needs and will be discharged from Outpatient OT.   PERFORMANCE DEFICITS: in functional skills including ADLs, IADLs, coordination, sensation, ROM, strength, pain, fascial restrictions, Fine motor control, body mechanics, and UE functional use.   PLAN:  OT FREQUENCY: 1x/week  OT DURATION: 4 weeks  PLANNED INTERVENTIONS: 97168 OT Re-evaluation, 97535 self care/ADL training, 19147 therapeutic exercise, 97530 therapeutic activity, 97112 neuromuscular re-education, 97140 manual therapy, 97018 paraffin, 82956 moist heat, 97032 electrical stimulation (manual), passive range of motion, functional mobility training, energy conservation, coping strategies training, patient/family education, and DME and/or AE instructions  RECOMMENDED OTHER SERVICES: Hand Specialist  CONSULTED AND AGREED WITH PLAN OF CARE: Patient  PLAN FOR NEXT  SESSION: Discharge   Mathew Postiglione Bing Plume, OTR/L [facility] [phone] Keymora Grillot Rosemarie Beath, OT 08/23/2023, 3:14 PM

## 2023-08-29 ENCOUNTER — Encounter (HOSPITAL_COMMUNITY): Admitting: Occupational Therapy

## 2023-08-29 ENCOUNTER — Telehealth: Payer: Self-pay | Admitting: Orthopaedic Surgery

## 2023-08-29 NOTE — Telephone Encounter (Signed)
 Dr. Sanjuan Dame pt - spoke w/the pt, he stated that his forms say he can go back to work 08/19/23 w/no restrictions.  I do see that, but I don't see a letter in his chart stating that.  He is wanting to pick up a letter stating that tomorrow, ok to give?

## 2023-08-30 NOTE — Telephone Encounter (Signed)
 LVM for the patient explaining it will most likely be Monday before I have an answer.

## 2023-09-03 NOTE — Telephone Encounter (Signed)
 It's documented in the 08/15/23 phone call message that I sent him asking pts work status. He replied he should be back to work now. I used that date to RTW. Thank you!

## 2023-09-04 ENCOUNTER — Ambulatory Visit: Payer: BC Managed Care – PPO | Admitting: Orthopaedic Surgery

## 2023-09-04 ENCOUNTER — Ambulatory Visit: Admitting: Orthopaedic Surgery

## 2023-09-11 ENCOUNTER — Encounter: Payer: Self-pay | Admitting: Orthopaedic Surgery

## 2023-09-11 ENCOUNTER — Ambulatory Visit: Admitting: Orthopaedic Surgery

## 2023-09-11 VITALS — BP 138/80 | HR 80 | Ht 71.0 in | Wt 126.0 lb

## 2023-09-11 DIAGNOSIS — Z0289 Encounter for other administrative examinations: Secondary | ICD-10-CM

## 2023-09-11 DIAGNOSIS — S62346D Nondisplaced fracture of base of fifth metacarpal bone, right hand, subsequent encounter for fracture with routine healing: Secondary | ICD-10-CM | POA: Diagnosis not present

## 2023-09-11 NOTE — Patient Instructions (Signed)
 He may resume full duty work as of September 15, 2023

## 2023-09-11 NOTE — Progress Notes (Signed)
 I am fine.  He has full ROM of the right hand, no pain, no difficulty.  He was discharged from OT.  NV intact.  Normal grip.    Encounter Diagnosis  Name Primary?   Closed nondisplaced fracture of base of fifth metacarpal bone of right hand with routine healing, subsequent encounter Yes   Discharge.  Return to full duty work, no restrictions, as of September 15, 2023.  Call if any problem.  Precautions discussed.  Electronically Signed Pleasant Brilliant, MD 4/16/20253:07 PM
